# Patient Record
Sex: Male | Born: 1960
Health system: Southern US, Community
[De-identification: ages and names within clinical notes are randomized; demographics above are authoritative.]

## PROBLEM LIST (undated history)

## (undated) DIAGNOSIS — G8929 Other chronic pain: Secondary | ICD-10-CM

## (undated) DIAGNOSIS — I10 Essential (primary) hypertension: Secondary | ICD-10-CM

## (undated) DIAGNOSIS — E079 Disorder of thyroid, unspecified: Secondary | ICD-10-CM

## (undated) DIAGNOSIS — M5416 Radiculopathy, lumbar region: Secondary | ICD-10-CM

## (undated) DIAGNOSIS — E039 Hypothyroidism, unspecified: Secondary | ICD-10-CM

## (undated) DIAGNOSIS — E785 Hyperlipidemia, unspecified: Secondary | ICD-10-CM

## (undated) DIAGNOSIS — M549 Dorsalgia, unspecified: Secondary | ICD-10-CM

## (undated) DIAGNOSIS — M1990 Unspecified osteoarthritis, unspecified site: Secondary | ICD-10-CM

## (undated) HISTORY — PX: OTHER SURGICAL HISTORY: SHX169

## (undated) HISTORY — PX: BACK SURGERY: SHX140

## (undated) HISTORY — PX: THYROIDECTOMY: SHX17

---

## 1999-04-18 ENCOUNTER — Inpatient Hospital Stay (HOSPITAL_COMMUNITY): Admission: RE | Admit: 1999-04-18 | Discharge: 1999-04-19 | Payer: Self-pay | Admitting: Neurosurgery

## 1999-04-18 ENCOUNTER — Encounter: Payer: Self-pay | Admitting: Neurosurgery

## 1999-05-09 ENCOUNTER — Ambulatory Visit (HOSPITAL_COMMUNITY): Admission: RE | Admit: 1999-05-09 | Discharge: 1999-05-09 | Payer: Self-pay | Admitting: Neurosurgery

## 1999-05-09 ENCOUNTER — Encounter: Payer: Self-pay | Admitting: Neurosurgery

## 2003-11-16 ENCOUNTER — Ambulatory Visit (HOSPITAL_COMMUNITY): Admission: RE | Admit: 2003-11-16 | Discharge: 2003-11-16 | Payer: Self-pay | Admitting: Family Medicine

## 2004-04-24 ENCOUNTER — Ambulatory Visit (HOSPITAL_COMMUNITY): Admission: RE | Admit: 2004-04-24 | Discharge: 2004-04-24 | Payer: Self-pay | Admitting: Family Medicine

## 2005-05-17 ENCOUNTER — Emergency Department (HOSPITAL_COMMUNITY): Admission: EM | Admit: 2005-05-17 | Discharge: 2005-05-17 | Payer: Self-pay | Admitting: Emergency Medicine

## 2008-07-03 ENCOUNTER — Ambulatory Visit (HOSPITAL_COMMUNITY): Admission: RE | Admit: 2008-07-03 | Discharge: 2008-07-03 | Payer: Self-pay | Admitting: Family Medicine

## 2008-07-06 ENCOUNTER — Encounter (INDEPENDENT_AMBULATORY_CARE_PROVIDER_SITE_OTHER): Payer: Self-pay | Admitting: Diagnostic Radiology

## 2008-07-06 ENCOUNTER — Ambulatory Visit (HOSPITAL_COMMUNITY): Admission: RE | Admit: 2008-07-06 | Discharge: 2008-07-06 | Payer: Self-pay | Admitting: Family Medicine

## 2008-08-29 ENCOUNTER — Encounter (INDEPENDENT_AMBULATORY_CARE_PROVIDER_SITE_OTHER): Payer: Self-pay | Admitting: Otolaryngology

## 2008-08-29 ENCOUNTER — Ambulatory Visit (HOSPITAL_COMMUNITY): Admission: RE | Admit: 2008-08-29 | Discharge: 2008-08-30 | Payer: Self-pay | Admitting: Otolaryngology

## 2010-02-27 ENCOUNTER — Emergency Department (HOSPITAL_COMMUNITY): Admission: EM | Admit: 2010-02-27 | Discharge: 2010-02-27 | Payer: Self-pay | Admitting: Emergency Medicine

## 2010-08-05 ENCOUNTER — Emergency Department (HOSPITAL_COMMUNITY): Admission: EM | Admit: 2010-08-05 | Discharge: 2010-08-05 | Payer: Self-pay | Admitting: Emergency Medicine

## 2010-12-07 ENCOUNTER — Emergency Department (HOSPITAL_COMMUNITY)
Admission: EM | Admit: 2010-12-07 | Discharge: 2010-12-07 | Disposition: A | Discharge status: Home / Self Care | Payer: BC Managed Care – PPO | Attending: Emergency Medicine | Admitting: Emergency Medicine

## 2010-12-07 ENCOUNTER — Emergency Department (HOSPITAL_COMMUNITY): Payer: BC Managed Care – PPO

## 2010-12-07 DIAGNOSIS — M545 Low back pain, unspecified: Secondary | ICD-10-CM | POA: Insufficient documentation

## 2010-12-07 DIAGNOSIS — M25559 Pain in unspecified hip: Secondary | ICD-10-CM | POA: Insufficient documentation

## 2010-12-08 LAB — URINALYSIS, ROUTINE W REFLEX MICROSCOPIC
Leukocytes, UA: NEGATIVE
pH: 6.5 (ref 5.0–8.0)

## 2010-12-08 LAB — URINE MICROSCOPIC-ADD ON

## 2010-12-08 LAB — BASIC METABOLIC PANEL
BUN: 13 mg/dL (ref 6–23)
CO2: 26 mEq/L (ref 19–32)
Chloride: 106 mEq/L (ref 96–112)
GFR calc Af Amer: >60 mL/min (ref >60)
Potassium: 3.5 mEq/L (ref 3.5–5.1)
Sodium: 138 mEq/L (ref 135–145)

## 2011-02-03 NOTE — Op Note (Signed)
[name], [name]               ACCOUNT NO.:  [id number]   MEDICAL RECORD NO.:  [id number]          PATIENT TYPE:  OIB   LOCATION:  5127                         FACILITY:  [facility]   PHYSICIAN:  Jefry H. Pollyann Kennedy, MD     DATE OF BIRTH:  July 21, 1961   DATE OF PROCEDURE:  08/29/2008  DATE OF DISCHARGE:                               OPERATIVE REPORT   PREOPERATIVE DIAGNOSIS:  Thyroid mass.   POSTOPERATIVE DIAGNOSIS:  Thyroid mass.   PROCEDURE:  Total thyroidectomy.   SURGEON:  Jefry H. Pollyann Kennedy, MD   ANESTHESIA:  General endotracheal anesthesia was used.   COMPLICATIONS:  No complications.   ASSISTANT:  Suzanna Obey, MD   BLOOD LOSS:  Minimal.   FINDINGS:  Large mass involving the central portion of the thyroid,  appeared to initiate in the isthmus but extended over to both lobes.  The remainder of the thyroid gland was somewhat enlarged as well.   REFERRING PHYSICIAN:  Kirk Ruths, M.D.   HISTORY:  This is a 50 year old who had an anterior neck mass for  several months.  Needle aspiration biopsy revealed follicular cells with  microfollicular patterns suspicious for follicular neoplasm.  Risks,  benefits, alternatives, complications of the procedure were explained to  the patient; he seemed to understand and agrees for surgery.   PROCEDURE:  The patient was taken to the operating room and placed on  the operating table in the supine position.  Following induction of  general endotracheal anesthesia, the patient was prepped and draped in  the standard fashion.  A transverse incision was outlined just above the  clavicle and electrocautery was used to incise the skin and subcutaneous  tissue.  A subplatysmal flap was developed superiorly to the thyroid  notch and inferiorly to the clavicle.  Self-retaining thyroid retractor  was used throughout the case.  Midline fascia was divided and the strap  muscles were reflected bilaterally.  The central mass was identified.  The  complete gland was then dissected starting on the right side and  completing on the left side.  The initial dissection was the superior  pole where the vessels were separately dissected, ligated between  clamps, and divided.  The 4-0 silk ties were used throughout the case.  The middle thyroid vein was also dissected and ligated and divided.  After the superior pole was brought down, attention was then paid to the  inferior pole where the vasculature was similarly ligated and divided.  A superior parathyroid was identified on the right, was preserved with  its blood supply, and had good color at the end of the case.  The  recurrent nerve was identified as I traveled up the tracheoesophageal  groove and the entrance into the larynx.  This was preserved.  The  ligament of Allyson Sabal was divided using silk ties as well and cautery  dissection.  The thyroid was brought off the face of the trachea.  The  same procedure was then performed on the left side bringing the left  lobe forward.  The entire lobe was then dissected off the trachea and  the larynx.  It was sent for pathologic evaluation.  The wound was  irrigated with saline.  Hemostasis was completed using bipolar cautery  and additional silk ties as needed.  The wound was closed in layers  using 3-0 chromic on the midline fascia and the platysma layer.  A 7-  [place] round JP drain was left in the wound and exited through the left  end of the incisions, secured in place with a nylon suture.  Dermabond  was used to reappose the skin.  The patient was awakened, extubated, and  transferred to recovery in stable condition.      Jefry H. Pollyann Kennedy, MD  Electronically Signed     JHR/MEDQ  D:  08/29/2008  T:  08/30/2008  Job:  [id number]   cc:   Kirk Ruths, M.D.

## 2011-06-26 LAB — BASIC METABOLIC PANEL
CO2: 27 mEq/L (ref 19–32)
Calcium: 10 mg/dL (ref 8.4–10.5)
GFR calc Af Amer: >60 mL/min (ref >60)
GFR calc non Af Amer: >60 mL/min (ref >60)
Potassium: 4 mEq/L (ref 3.5–5.1)

## 2011-06-26 LAB — CBC
HCT: 48.3 % (ref 39.0–52.0)
Hemoglobin: 16 g/dL (ref 13.0–17.0)
MCV: 94.9 fL (ref 78.0–100.0)
RBC: 5.09 MIL/uL (ref 4.22–5.81)

## 2011-06-26 LAB — CALCIUM: Calcium: 9.2 mg/dL (ref 8.4–10.5)

## 2011-09-23 ENCOUNTER — Telehealth: Payer: Self-pay

## 2011-09-23 NOTE — Telephone Encounter (Signed)
LMOM to call.

## 2011-10-13 NOTE — Telephone Encounter (Signed)
Letter was mailed on 09/30/2011 to call.

## 2011-10-13 NOTE — Telephone Encounter (Signed)
LMOM to call and also mailing a letter to call.

## 2012-01-11 ENCOUNTER — Other Ambulatory Visit: Payer: Self-pay

## 2012-01-11 ENCOUNTER — Emergency Department (HOSPITAL_COMMUNITY): Payer: BC Managed Care – PPO

## 2012-01-11 ENCOUNTER — Encounter (HOSPITAL_COMMUNITY): Payer: Self-pay

## 2012-01-11 ENCOUNTER — Emergency Department (HOSPITAL_COMMUNITY)
Admission: EM | Admit: 2012-01-11 | Discharge: 2012-01-11 | Disposition: A | Discharge status: Home / Self Care | Payer: BC Managed Care – PPO | Attending: Emergency Medicine | Admitting: Emergency Medicine

## 2012-01-11 DIAGNOSIS — I10 Essential (primary) hypertension: Secondary | ICD-10-CM | POA: Diagnosis present

## 2012-01-11 DIAGNOSIS — M5412 Radiculopathy, cervical region: Secondary | ICD-10-CM | POA: Insufficient documentation

## 2012-01-11 DIAGNOSIS — R208 Other disturbances of skin sensation: Secondary | ICD-10-CM

## 2012-01-11 DIAGNOSIS — M542 Cervicalgia: Secondary | ICD-10-CM | POA: Insufficient documentation

## 2012-01-11 DIAGNOSIS — R209 Unspecified disturbances of skin sensation: Secondary | ICD-10-CM | POA: Insufficient documentation

## 2012-01-11 DIAGNOSIS — R51 Headache: Secondary | ICD-10-CM | POA: Insufficient documentation

## 2012-01-11 DIAGNOSIS — M25519 Pain in unspecified shoulder: Secondary | ICD-10-CM | POA: Insufficient documentation

## 2012-01-11 DIAGNOSIS — E785 Hyperlipidemia, unspecified: Secondary | ICD-10-CM | POA: Insufficient documentation

## 2012-01-11 HISTORY — DX: Hyperlipidemia, unspecified: E78.5

## 2012-01-11 HISTORY — DX: Essential (primary) hypertension: I10

## 2012-01-11 LAB — BASIC METABOLIC PANEL
Chloride: 100 mEq/L (ref 96–112)
Glucose, Bld: 91 mg/dL (ref 70–99)

## 2012-01-11 LAB — CBC
HCT: 43.5 % (ref 39.0–52.0)
MCH: 30.7 pg (ref 26.0–34.0)
MCHC: 33.3 g/dL (ref 30.0–36.0)
Platelets: 260 10*3/uL (ref 150–400)
RDW: 13.7 % (ref 11.5–15.5)

## 2012-01-11 LAB — DIFFERENTIAL
Basophils Relative: 0 % (ref 0–1)
Eosinophils Absolute: 0.2 10*3/uL (ref 0.0–0.7)
Lymphocytes Relative: 31 % (ref 12–46)
Monocytes Absolute: 0.4 10*3/uL (ref 0.1–1.0)
Monocytes Relative: 6 % (ref 3–12)
Neutro Abs: 3.4 10*3/uL (ref 1.7–7.7)

## 2012-01-11 LAB — POCT I-STAT TROPONIN I: Troponin i, poc: 0.01 ng/mL (ref 0.00–0.08)

## 2012-01-11 MED ORDER — HYDROCODONE-ACETAMINOPHEN 5-325 MG PO TABS
2.0000 | ORAL_TABLET | Freq: Once | ORAL | Status: AC
  Administered 2012-01-11: 2 via ORAL
  Filled 2012-01-11: qty 2

## 2012-01-11 MED ORDER — ASPIRIN 325 MG PO TABS
325.0000 mg | ORAL_TABLET | Freq: Once | ORAL | Status: AC
  Administered 2012-01-11: 325 mg via ORAL
  Filled 2012-01-11: qty 1

## 2012-01-11 MED ORDER — IBUPROFEN 800 MG PO TABS
800.0000 mg | ORAL_TABLET | Freq: Once | ORAL | Status: AC
  Administered 2012-01-11: 800 mg via ORAL
  Filled 2012-01-11: qty 1

## 2012-01-11 MED ORDER — DIAZEPAM 5 MG PO TABS: 5.0000 mg | ORAL_TABLET | Freq: Four times a day (QID) | ORAL | Status: AC | PRN

## 2012-01-11 MED ORDER — IBUPROFEN 800 MG PO TABS: 800.0000 mg | ORAL_TABLET | Freq: Three times a day (TID) | ORAL | Status: AC | PRN

## 2012-01-11 MED ORDER — HYDROCODONE-ACETAMINOPHEN 5-325 MG PO TABS: 1.0000 | ORAL_TABLET | ORAL | Status: AC | PRN

## 2012-01-11 MED ORDER — DIAZEPAM 5 MG PO TABS
5.0000 mg | ORAL_TABLET | Freq: Once | ORAL | Status: AC
  Administered 2012-01-11: 5 mg via ORAL
  Filled 2012-01-11: qty 1

## 2012-01-11 NOTE — Discharge Instructions (Signed)
Cervical Radiculopathy Cervical radiculopathy happens when a nerve in the neck is pinched or bruised by a slipped (herniated) disk or by arthritic changes in the bones of the cervical spine. This can occur due to an injury or as part of the normal aging process. Pressure on the cervical nerves can cause pain or numbness that runs from your neck all the way down into your arm and fingers. CAUSES  There are many possible causes, including:  Injury.   Muscle tightness in the neck from overuse.   Swollen, painful joints (arthritis).   Breakdown or degeneration in the bones and joints of the spine (spondylosis) due to aging.   Bone spurs that may develop near the cervical nerves.  SYMPTOMS  Symptoms include pain, weakness, or numbness in the affected arm and hand. Pain can be severe or irritating. Symptoms may be worse when extending or turning the neck. DIAGNOSIS  Your caregiver will ask about your symptoms and do a physical exam. He or she may test your strength and reflexes. X-rays, CT scans, and MRI scans may be needed in cases of injury or if the symptoms do not go away after a period of time. Electromyography (EMG) or nerve conduction testing may be done to study how your nerves and muscles are working. TREATMENT  Your caregiver may recommend certain exercises to help relieve your symptoms. Cervical radiculopathy can, and often does, get better with time and treatment. If your problems continue, treatment options may include:  Wearing a soft collar for short periods of time.   Physical therapy to strengthen the neck muscles.   Medicines, such as nonsteroidal anti-inflammatory drugs (NSAIDs), oral corticosteroids, or spinal injections.   Surgery. Different types of surgery may be done depending on the cause of your problems.  HOME CARE INSTRUCTIONS   Put ice on the affected area.   Put ice in a plastic bag.   Place a towel between your skin and the bag.   Leave the ice on for 15  to 20 minutes, 3 to 4 times a day or as directed by your caregiver.   Use a flat pillow when you sleep.   Only take over-the-counter or prescription medicines for pain, discomfort, or fever as directed by your caregiver.   If physical therapy was prescribed, follow your caregiver's directions.   If a soft collar was prescribed, use it as directed.  SEEK IMMEDIATE MEDICAL CARE IF:   Your pain gets much worse and cannot be controlled with medicines.   You have weakness or numbness in your hand, arm, face, or leg.   You have a high fever or a stiff, rigid neck.   You lose bowel or bladder control (incontinence).   You have trouble with walking, balance, or speaking.  MAKE SURE YOU:   Understand these instructions.   Will watch your condition.   Will get help right away if you are not doing well or get worse.  Document Released: 06/02/2001 Document Revised: 08/27/2011 Document Reviewed: 04/21/2011 [facility] Patient Information 2012 [place], [place].Paresthesia Paresthesia is an abnormal burning or prickling sensation. This sensation is generally felt in the hands, arms, legs, or feet. However, it may occur in any part of the body. It is usually not painful. The feeling may be described as:  Tingling or numbness.   "Pins and needles."   Skin crawling.   Buzzing.   Limbs "falling asleep."   Itching.  Most people experience temporary (transient) paresthesia at some time in their lives. CAUSES  Paresthesia may occur when you breathe too quickly (hyperventilation). It can also occur without any apparent cause. Commonly, paresthesia occurs when pressure is placed on a nerve. The feeling quickly goes away once the pressure is removed. For some people, however, paresthesia is a long-lasting (chronic) condition caused by an underlying disorder. The underlying disorder may be:  A traumatic, direct injury to nerves. Examples include a:   Broken (fractured) neck.   Fractured skull.     A disorder affecting the brain and spinal cord (central nervous system). Examples include:   Transverse myelitis.   Encephalitis.   Transient ischemic attack.   Multiple sclerosis.   Stroke.   Tumor or blood vessel problems, such as an arteriovenous malformation pressing against the brain or spinal cord.   A condition that damages the peripheral nerves (peripheral neuropathy). Peripheral nerves are not part of the brain and spinal cord. These conditions include:   Diabetes.   Peripheral vascular disease.   Nerve entrapment syndromes, such as carpal tunnel syndrome.   Shingles.   Hypothyroidism.   Vitamin B12 deficiencies.   Alcoholism.   Heavy metal poisoning (lead, arsenic).   Rheumatoid arthritis.   Systemic lupus erythematosus.  DIAGNOSIS  Your caregiver will attempt to find the underlying cause of your paresthesia. Your caregiver may:  Take your medical history.   Perform a physical exam.   Order various lab tests.   Order imaging tests.  TREATMENT  Treatment for paresthesia depends on the underlying cause. HOME CARE INSTRUCTIONS  Avoid drinking alcohol.   You may consider massage or acupuncture to help relieve your symptoms.   Keep all follow-up appointments as directed by your caregiver.  SEEK IMMEDIATE MEDICAL CARE IF:   You feel weak.   You have trouble walking or moving.   You have problems with speech or vision.   You feel confused.   You cannot control your bladder or bowel movements.   You feel numbness after an injury.   You faint.   Your burning or prickling feeling gets worse when walking.   You have pain, cramps, or dizziness.   You develop a rash.  MAKE SURE YOU:  Understand these instructions.   Will watch your condition.   Will get help right away if you are not doing well or get worse.  Document Released: 08/28/2002 Document Revised: 08/27/2011 Document Reviewed: 05/29/2011 [facility] Patient Information 2012  [place], [place].Peripheral Nerve Problems Peripheral nerve disorders are problems with the nerves in your arms or legs. CAUSES  There are many different causes of these disorders. They include:  Injury.   Diabetes.   Chronic alcoholism.   Toxic chemicals and drugs.   Vitamin deficiencies.   Tumors.   Liver or kidney diseases.  SYMPTOMS  Some of the problems caused are:  Tingling, burning, pain, and numbness in the extremities and feet.   Weakness, loss of muscle tone, and size.  DIAGNOSIS  Sometimes blood tests and studies to examine nerve function are needed to make the diagnosis.  TREATMENT  Sometimes peripheral nerve problems can be treated with vitamins, medication, and avoiding known toxins such as alcohol. Please make a follow-up appointment to be sure you are getting better with treatment.  SEEK MEDICAL CARE IF:   You are not better after one week of treatment.   You have worsening of problems or have breathing trouble.  Document Released: 10/15/2004 Document Revised: 08/27/2011 Document Reviewed: 09/07/2005 [facility] Patient Information 2012 [place], [place].Neuropathic Pain We often think that pain has a  physical cause. If we get rid of the cause, the pain should go away. Nerves themselves can also cause pain. It is called neuropathic pain, which means nerve abnormality. It may be difficult for the patients who have it and for the treating caregivers. Pain is usually described as acute (short-lived) or chronic (long-lasting). Acute pain is related to the physical sensations caused by an injury. It can last from a few seconds to many weeks, but it usually goes away when normal healing occurs. Chronic pain lasts beyond the typical healing time. With neuropathic pain, the nerve fibers themselves may be damaged or injured. They then send incorrect signals to other pain centers. The pain you feel is real, but the cause is not easy to find.  CAUSES  Chronic pain can result from  diseases, such as diabetes and shingles (an infection related to chickenpox), or from trauma, surgery, or amputation. It can also happen without any known injury or disease. The nerves are sending pain messages, even though there is no identifiable cause for such messages.   Other common causes of neuropathy include diabetes, phantom limb pain, or Regional Pain Syndrome (RPS).   As with all forms of chronic back pain, if neuropathy is not correctly treated, there can be a number of associated problems that lead to a downward cycle for the patient. These include depression, sleeplessness, feelings of fear and anxiety, limited social interaction and inability to do normal daily activities or work.   The most dramatic and mysterious example of neuropathic pain is called "phantom limb syndrome." This occurs when an arm or a leg has been removed because of illness or injury. The brain still gets pain messages from the nerves that originally carried impulses from the missing limb. These nerves now seem to misfire and cause troubling pain.   Neuropathic pain often seems to have no cause. It responds poorly to standard pain treatment.  Neuropathic pain can occur after:  Shingles (herpes zoster virus infection).   A lasting burning sensation of the skin, caused usually by injury to a peripheral nerve.   Peripheral neuropathy which is widespread nerve damage, often caused by diabetes or alcoholism.   Phantom limb pain following an amputation.   Facial nerve problems (trigeminal neuralgia).   Multiple sclerosis.   Reflex sympathetic dystrophy.   Pain which comes with cancer and cancer chemotherapy.   Entrapment neuropathy such as when pressure is put on a nerve such as in carpal tunnel syndrome.   Back, leg, and hip problems (sciatica).   Spine or back surgery.   HIV Infection or AIDS where nerves are infected by viruses.  Your caregiver can explain items in the above list which may apply to  you. SYMPTOMS  Characteristics of neuropathic pain are:  Severe, sharp, electric shock-like, shooting, lightening-like, knife-like.   Pins and needles sensation.   Deep burning, deep cold, or deep ache.   Persistent numbness, tingling, or weakness.   Pain resulting from light touch or other stimulus that would not usually cause pain.   Increased sensitivity to something that would normally cause pain, such as a pinprick.  Pain may persist for months or years following the healing of damaged tissues. When this happens, pain signals no longer sound an alarm about current injuries or injuries about to happen. Instead, the alarm system itself is not working correctly.  Neuropathic pain may get worse instead of better over time. For some people, it can lead to serious disability. It is important to be  aware that severe injury in a limb can occur without a proper, protective pain response.Burns, cuts, and other injuries may go unnoticed. Without proper treatment, these injuries can become infected or lead to further disability. Take any injury seriously, and consult your caregiver for treatment. DIAGNOSIS  When you have a pain with no known cause, your caregiver will probably ask some specific questions:   Do you have any other conditions, such as diabetes, shingles, multiple sclerosis, or HIV infection?   How would you describe your pain? (Neuropathic pain is often described as shooting, stabbing, burning, or searing.)   Is your pain worse at any time of the day? (Neuropathic pain is usually worse at night.)   Does the pain seem to follow a certain physical pathway?   Does the pain come from an area that has missing or injured nerves? (An example would be phantom limb pain.)   Is the pain triggered by minor things such as rubbing against the sheets at night?  These questions often help define the type of pain involved. Once your caregiver knows what is happening, treatment can begin.  Anticonvulsant, antidepressant drugs, and various pain relievers seem to work in some cases. If another condition, such as diabetes is involved, better management of that disorder may relieve the neuropathic pain.  TREATMENT  Neuropathic pain is frequently long-lasting and tends not to respond to treatment with narcotic type pain medication. It may respond well to other drugs such as antiseizure and antidepressant medications. Usually, neuropathic problems do not completely go away, but partial improvement is often possible with proper treatment. Your caregivers have large numbers of medications available to treat you. Do not be discouraged if you do not get immediate relief. Sometimes different medications or a combination of medications will be tried before you receive the results you are hoping for. See your caregiver if you have pain that seems to be coming from nowhere and does not go away. Help is available.  SEEK IMMEDIATE MEDICAL CARE IF:   There is a sudden change in the quality of your pain, especially if the change is on only one side of the body.   You notice changes of the skin, such as redness, black or purple discoloration, swelling, or an ulcer.   You cannot move the affected limbs.  Document Released: 06/04/2004 Document Revised: 08/27/2011 Document Reviewed: 06/04/2004 [facility] Patient Information 2012 [place], [place].Radicular Pain Radicular pain in either the arm or leg is usually from a bulging or herniated disk in the spine. A piece of the herniated disk may press against the nerves as the nerves exit the spine. This causes pain which is felt at the tips of the nerves down the arm or leg. Other causes of radicular pain may include:  Fractures.   Heart disease.   Cancer.   An abnormal and usually degenerative state of the nervous system or nerves (neuropathy).  Diagnosis may require CT or MRI scanning to determine the primary cause.  Nerves that start at the neck (nerve  roots) may cause radicular pain in the outer shoulder and arm. It can spread down to the thumb and fingers. The symptoms vary depending on which nerve root has been affected. In most cases radicular pain improves with conservative treatment. Neck problems may require physical therapy, a neck collar, or cervical traction. Treatment may take many weeks, and surgery may be considered if the symptoms do not improve.  Conservative treatment is also recommended for sciatica. Sciatica causes pain to radiate from  the lower back or buttock area down the leg into the foot. Often there is a history of back problems. Most patients with sciatica are better after 2 to 4 weeks of rest and other supportive care. Short term bed rest can reduce the disk pressure considerably. Sitting, however, is not a good position since this increases the pressure on the disk. You should avoid bending, lifting, and all other activities which make the problem worse. Traction can be used in severe cases. Surgery is usually reserved for patients who do not improve within the first months of treatment. Only take over-the-counter or prescription medicines for pain, discomfort, or fever as directed by your caregiver. Narcotics and muscle relaxants may help by relieving more severe pain and spasm and by providing mild sedation. Cold or massage can give significant relief. Spinal manipulation is not recommended. It can increase the degree of disc protrusion. Epidural steroid injections are often effective treatment for radicular pain. These injections deliver medicine to the spinal nerve in the space between the protective covering of the spinal cord and back bones (vertebrae). Your caregiver can give you more information about steroid injections. These injections are most effective when given within two weeks of the onset of pain.  You should see your caregiver for follow up care as recommended. A program for neck and back injury rehabilitation with  stretching and strengthening exercises is an important part of management.  SEEK IMMEDIATE MEDICAL CARE IF:  You develop increased pain, weakness, or numbness in your arm or leg.   You develop difficulty with bladder or bowel control.   You develop abdominal pain.  Document Released: 10/15/2004 Document Revised: 08/27/2011 Document Reviewed: 12/31/2008 [facility] Patient Information 2012 [place], [place].

## 2012-01-11 NOTE — ED Notes (Signed)
Patient lying on stretcher; states pain has eased some.  Now rates as 3-4/10.

## 2012-01-11 NOTE — ED Provider Notes (Addendum)
History     CSN: [id number]  Arrival date & time 01/11/12  0154   First MD Initiated Contact with Patient 01/11/12 0203      Chief Complaint  Patient presents with  . Numbness    (Consider location/radiation/quality/duration/timing/severity/associated sxs/prior treatment) Patient is a 51 y.o. male presenting with neurologic complaint. The history is provided by the patient and the spouse.  Neurologic Problem The primary symptoms include paresthesias and focal weakness. Primary symptoms do not include headaches, syncope, loss of consciousness, altered mental status, seizures, dizziness, visual change, loss of sensation, speech change, memory loss, fever, nausea or vomiting. The symptoms began 6 to 12 hours ago (11 to 12 hours ago, yesterday afternoon, patient is not sure of the exact time of onset as symptoms were insidious in onset.). The episode lasted 12 hours (estimated 11 or 12 hours of dysesthesia/pain down his lateral left upper extremity from deltoid to hand in c5-c7 distribution.  Occasional intermittent paresthesias at fingertips of left digits 2&3 x many weeks.). The symptoms are improving. The neurological symptoms are focal (left upper extremity.  No neck pain or stiffness.  Remote history of ruptured cervical disc with subsequent cervical fusion (plate, anterior) ). Context: no contextual factors, exacerbating/alleviating factors, or other associated symptoms.  No recent trauma, no neck pain.  No other symptoms. Subjective left grip strength weakness per patient without objective defecit on exam.   The paresthesias are improving. The paresthesias are described as burning and pricking.  Additional symptoms include weakness (left grip strength of hand, subjective only, no objective defecit) and pain. Additional symptoms do not include neck stiffness, lower back pain, leg pain, loss of balance, photophobia, nystagmus, hyperacusis, hearing loss, tinnitus, vertigo, anxiety, irritability or  dysphoric mood. Medical issues also include hypertension. Medical issues do not include seizures, cerebral vascular accident, diabetes or recent surgery. Procedure history comments: none.    Past Medical History  Diagnosis Date  . Hypertension 01/11/2012    Elevated BP on ED presentation for what is likely unrelated symptoms, patient reports a history of hypertension that he is not currently taking medications for, and ECG today and prior suggest left ventricular hypertrophy, a likely sequelae of chronic untreated hypertension if indeed it is present.  Strongly encouraged pt. To contact PCP this week (01/11/12) for re-eval and to initiate antihyperte  . Hyperlipidemia 01/11/2012    Pt. Reported history of hyperlipidemia, not currently taking medications for this condition    Past Surgical History  Procedure Date  . Thyroidectomy     No family history on file.  History  Substance Use Topics  . Smoking status: Current Everyday Smoker  . Smokeless tobacco: Not on file  . Alcohol Use: Yes      Review of Systems  Constitutional: Negative for fever, chills, diaphoresis, activity change, appetite change, irritability, fatigue and unexpected weight change.  HENT: Negative for hearing loss, ear pain, nosebleeds, congestion, sore throat, facial swelling, rhinorrhea, sneezing, mouth sores, trouble swallowing, neck pain, neck stiffness, dental problem, postnasal drip, sinus pressure, tinnitus and ear discharge.   Eyes: Negative for photophobia, pain, discharge, redness, itching and visual disturbance.  Respiratory: Negative for cough, chest tightness and shortness of breath.   Cardiovascular: Negative for chest pain, palpitations and syncope.  Gastrointestinal: Negative for nausea, vomiting, abdominal pain and diarrhea.  Genitourinary: Negative.   Musculoskeletal: Negative for myalgias and back pain.  Skin: Negative for color change, pallor, rash and wound.  Neurological: Positive for focal  weakness, weakness (left grip strength of  hand, subjective only, no objective defecit) and paresthesias. Negative for dizziness, vertigo, tremors, speech change, seizures, loss of consciousness, syncope, facial asymmetry, speech difficulty, light-headedness, numbness, headaches and loss of balance.  Hematological: Negative for adenopathy. Does not bruise/bleed easily.  Psychiatric/Behavioral: Negative.  Negative for memory loss, dysphoric mood and altered mental status.  All other systems reviewed and are negative.    Allergies  Review of patient's allergies indicates no known allergies.  Home Medications   Current Outpatient Rx  Name Route Sig Dispense Refill  . LEVOTHYROXINE SODIUM 100 MCG PO TABS Oral Take 100 mcg by mouth daily.      BP 169/115  Pulse 86  Temp(Src) 98 F (36.7 C) (Oral)  Resp 18  Ht 5\' 9"  (1.753 m)  Wt 175 lb (79.379 kg)  BMI 25.84 kg/m2  SpO2 98%  Physical Exam  Nursing note and vitals reviewed. Constitutional: He is oriented to person, place, and time. He appears well-nourished. He is active and cooperative.  Non-toxic appearance. He does not have a sickly appearance. He does not appear ill. No distress.  HENT:  Head: Normocephalic and atraumatic. Head is without raccoon's eyes, without Battle's sign and without contusion. No trismus in the jaw.  Right Ear: Hearing, tympanic membrane, external ear and ear canal normal.  Left Ear: Hearing, tympanic membrane, external ear and ear canal normal.  Nose: Nose normal.  Mouth/Throat: Uvula is midline, oropharynx is clear and moist and mucous membranes are normal. No uvula swelling. No oropharyngeal exudate.  Eyes: Conjunctivae, EOM and lids are normal. Pupils are equal, round, and reactive to light. Right eye exhibits no chemosis, no discharge and no exudate. Left eye exhibits no chemosis, no discharge and no exudate. Right conjunctiva is not injected. Left conjunctiva is not injected. Right eye exhibits normal  extraocular motion and no nystagmus. Left eye exhibits normal extraocular motion and no nystagmus.  Neck: Trachea normal, normal range of motion and phonation normal. Neck supple. No JVD present. Carotid bruit is not present. No rigidity. Normal range of motion present. No Brudzinski's sign noted.  Cardiovascular: Normal rate, regular rhythm, S1 normal, S2 normal, intact distal pulses and normal pulses.  Exam reveals no gallop and no friction rub.   Pulmonary/Chest: Effort normal and breath sounds normal. No accessory muscle usage. Not tachypneic. No respiratory distress. He has no decreased breath sounds. He has no wheezes. He has no rhonchi. He has no rales.  Abdominal: Soft. Normal appearance, normal aorta and bowel sounds are normal. He exhibits no pulsatile midline mass. There is no tenderness. There is no rigidity, no rebound and no guarding.  Musculoskeletal:       Cervical back: Normal. He exhibits normal range of motion, no tenderness, no bony tenderness, no deformity, no pain and no spasm.       Back:  Neurological: He is alert and oriented to person, place, and time. He has normal strength and normal reflexes. He is not disoriented. He displays no tremor and normal reflexes. No cranial nerve deficit or sensory deficit. He exhibits normal muscle tone. He displays no seizure activity. Coordination and gait normal. GCS eye subscore is 4. GCS verbal subscore is 5. GCS motor subscore is 6.  Reflex Scores:      Bicep reflexes are 2+ on the right side and 2+ on the left side.      Brachioradialis reflexes are 2+ on the right side and 2+ on the left side.      Patellar reflexes are 2+ on  the right side and 2+ on the left side.      NIHSS completed at initial evaluation with a score of 0.  No objective neurologic deficits, including intact sensation bilaterally.  The distribution of reported dysesthesia seems to correlate within distribution of  dermatomes C5-C7 suggesting radiculopathy, either at  the cervical foramen or peripherally as nerves course through muscle.  No axillary pain or mass to suggest brachial plexus lesion.  Abduction, adduction, flexion, and extension at the shoulders bilaterally with preserved range of motion and 5 out of 5 symmetrical strength. Flexion, extension, supination, pronation of the elbows and wrists as well as abduction and at the junction of the wrists and fingers are all preserved and symmetrical with 5 of 5 strength bilaterally. Intrinsic functions of both hands and grip strength is 5 out of 5 bilaterally and symmetric. Lower extremity strength is 5 out of 5, appropriate and symmetric bilaterally.  Skin: Skin is warm, dry and intact. He is not diaphoretic. No pallor.  Psychiatric: He has a normal mood and affect. His speech is normal and behavior is normal. Thought content normal. Cognition and memory are normal.    ED Course  Procedures (including critical care time)   Date: 01/11/2012  Rate: 74  Rhythm: normal sinus rhythm  QRS Axis: normal  Intervals: normal  ST/T Wave abnormalities: ST elevations anteriorly, early repolarization and Concave ST segments at areas of elevation with findings suggestive of left ventricular hypertrophy as the likely cause of early repolarization changes which are unchanged from previous EKG and not suspected to represent STEMI  Conduction Disutrbances:none  Narrative Interpretation: Findings are concerning for left ventricular hypertrophy, and if present is likely due to untreated hypertension.  Old EKG Reviewed: unchanged    Labs Reviewed  CBC  DIFFERENTIAL  BASIC METABOLIC PANEL  POCT I-STAT TROPONIN I   Ct Head Wo Contrast  01/11/2012  *RADIOLOGY REPORT*  Clinical Data:  Headache.  Left sided neck and shoulder pain for 2 days.  Numbness in the left arm.  No injury.  CT HEAD WITHOUT CONTRAST CT CERVICAL SPINE WITHOUT CONTRAST  Technique:  Multidetector CT imaging of the head and cervical spine was performed  following the standard protocol without intravenous contrast.  Multiplanar CT image reconstructions of the cervical spine were also generated.  Comparison:  None  CT HEAD  Findings: Ventricles and sulci appear symmetrical.  No mass effect or midline shift.  No abnormal extra-axial fluid collections.  Wallace Cullens- white matter junctions are distinct.  Basal cisterns are not effaced.  No ventricular dilatation.  Cavum septum pellucidum.  No evidence of acute intracranial hemorrhage.  Visualized paranasal sinuses and mastoid air cells are not opacified.  No depressed skull fractures.  IMPRESSION: No acute intracranial abnormalities.  CT CERVICAL SPINE  Findings: Reversal of the usual cervical lordosis is likely due to degenerative change and postoperative change.  Ligamentous injury is not excluded.  There are postoperative changes with fusion of C4- C5 and anterior plate and screw fixation and intervertebral fusion of C5-C6.  Degenerative changes at C3-4.  No vertebral compression deformities.  No prevertebral soft tissue swelling.  Lateral masses of C1 appear symmetrical.  The odontoid process appears intact.  No paraspinal soft tissue infiltration.  No focal bone lesion or bone destruction.  IMPRESSION: Postoperative changes from C4-C6.  Degenerative changes.  Reversal of the usual cervical lordosis is likely due to degenerative change and postoperative change.  No displaced fractures identified.  Original Report Authenticated By: Marlon Pel,  M.D.   Ct Cervical Spine Wo Contrast  01/11/2012  *RADIOLOGY REPORT*  Clinical Data:  Headache.  Left sided neck and shoulder pain for 2 days.  Numbness in the left arm.  No injury.  CT HEAD WITHOUT CONTRAST CT CERVICAL SPINE WITHOUT CONTRAST  Technique:  Multidetector CT imaging of the head and cervical spine was performed following the standard protocol without intravenous contrast.  Multiplanar CT image reconstructions of the cervical spine were also generated.   Comparison:  None  CT HEAD  Findings: Ventricles and sulci appear symmetrical.  No mass effect or midline shift.  No abnormal extra-axial fluid collections.  Wallace Cullens- white matter junctions are distinct.  Basal cisterns are not effaced.  No ventricular dilatation.  Cavum septum pellucidum.  No evidence of acute intracranial hemorrhage.  Visualized paranasal sinuses and mastoid air cells are not opacified.  No depressed skull fractures.  IMPRESSION: No acute intracranial abnormalities.  CT CERVICAL SPINE  Findings: Reversal of the usual cervical lordosis is likely due to degenerative change and postoperative change.  Ligamentous injury is not excluded.  There are postoperative changes with fusion of C4- C5 and anterior plate and screw fixation and intervertebral fusion of C5-C6.  Degenerative changes at C3-4.  No vertebral compression deformities.  No prevertebral soft tissue swelling.  Lateral masses of C1 appear symmetrical.  The odontoid process appears intact.  No paraspinal soft tissue infiltration.  No focal bone lesion or bone destruction.  IMPRESSION: Postoperative changes from C4-C6.  Degenerative changes.  Reversal of the usual cervical lordosis is likely due to degenerative change and postoperative change.  No displaced fractures identified.  Original Report Authenticated By: Marlon Pel, M.D.     No diagnosis found.    MDM  The patient does not meet criteria for a code stroke or for thrombolytic treatment of acute stroke do to an NIHSS score of 0 with no objective neurologic deficits. I do not perceive the patient's symptoms to be due to an acute stroke, however I will obtain a CT scan of the patient's brain to further exclude this possibility, and noting that an MRI would be needed for definitive exclusion of stroke, but my suspicion of stroke is not high enough to merit obtaining an MRI of the brain, emergent or otherwise at this time. I do have suspicion about the possibility of his  symptoms being do to cervical radiculopathy or peripheral radiculopathy of the spinal nerves exiting at the cervical level, whether do to spondylosis and neural foraminal stenosis, or peripheral muscle impingement. I have included a CT scan of the patient's cervical spine to further evaluate this as well. If no explanation is offered by imaging and laboratory studies on his workup today, the patient will be referred to outpatient neurology consultation in office for further evaluation of these symptoms.   Regarding the patient's self-reported history of hypertension and hyperlipidemia that he is not taking medications for, I reviewed documented encounters with his primary care clinic, and they are sparse do to apparent difficulty reaching the patient and getting him into the clinic for evaluation and treatment despite documented efforts on behalf of his providers. It appears that his nontreatment for these reported chronic conditions is due to his noncompliance with primary care followup. I discussed his EKG findings worrisome for left ventricular hypertrophy which if that is present would be due to untreated hypertension most likely, and discussed with him the importance of following up with his primary care clinic within this week for reevaluation  of blood pressure and initiation of antihypertensive management as seemed appropriate. The patient states his understanding of and agreement with the plan of care.    Felisa Bonier, MD 01/11/12 0440  4:53 AM No obvious pathology to explain the patient's symptoms is found on CT brain or cervical spine. At this time, my impression is that the most likely cause of the patient's symptoms are peripheral radiculopathy likely due to muscle spasm and I will try treatment of his symptoms and at that cause pending followup with his primary care physician and neurologist as an outpatient. The patient states his understanding of and agreement with the plan of  care.  Felisa Bonier, MD 01/11/12 [phone]

## 2012-01-11 NOTE — ED Notes (Signed)
Intermittent numbness to left arm, mostly to upper arm, worse at night, denies injury or trauma

## 2012-08-23 ENCOUNTER — Encounter (HOSPITAL_COMMUNITY): Payer: Self-pay | Admitting: Emergency Medicine

## 2012-08-23 ENCOUNTER — Emergency Department (HOSPITAL_COMMUNITY)
Admission: EM | Admit: 2012-08-23 | Discharge: 2012-08-23 | Disposition: A | Discharge status: Home / Self Care | Payer: BC Managed Care – PPO | Attending: Emergency Medicine | Admitting: Emergency Medicine

## 2012-08-23 DIAGNOSIS — Z8639 Personal history of other endocrine, nutritional and metabolic disease: Secondary | ICD-10-CM | POA: Insufficient documentation

## 2012-08-23 DIAGNOSIS — Z79899 Other long term (current) drug therapy: Secondary | ICD-10-CM | POA: Insufficient documentation

## 2012-08-23 DIAGNOSIS — Z862 Personal history of diseases of the blood and blood-forming organs and certain disorders involving the immune mechanism: Secondary | ICD-10-CM | POA: Insufficient documentation

## 2012-08-23 DIAGNOSIS — E785 Hyperlipidemia, unspecified: Secondary | ICD-10-CM | POA: Insufficient documentation

## 2012-08-23 DIAGNOSIS — M436 Torticollis: Secondary | ICD-10-CM | POA: Insufficient documentation

## 2012-08-23 DIAGNOSIS — F17200 Nicotine dependence, unspecified, uncomplicated: Secondary | ICD-10-CM | POA: Insufficient documentation

## 2012-08-23 DIAGNOSIS — I10 Essential (primary) hypertension: Secondary | ICD-10-CM | POA: Insufficient documentation

## 2012-08-23 MED ORDER — KETOROLAC TROMETHAMINE 60 MG/2ML IM SOLN
60.0000 mg | Freq: Once | INTRAMUSCULAR | Status: AC
  Administered 2012-08-23: 60 mg via INTRAMUSCULAR
  Filled 2012-08-23: qty 2

## 2012-08-23 MED ORDER — NAPROXEN 500 MG PO TABS: 500.0000 mg | ORAL_TABLET | Freq: Two times a day (BID) | ORAL | Status: DC

## 2012-08-23 MED ORDER — DIAZEPAM 5 MG PO TABS: 5.0000 mg | ORAL_TABLET | Freq: Two times a day (BID) | ORAL | Status: DC

## 2012-08-23 MED ORDER — OXYCODONE-ACETAMINOPHEN 5-325 MG PO TABS: 1.0000 | ORAL_TABLET | ORAL | Status: DC | PRN

## 2012-08-23 NOTE — ED Provider Notes (Signed)
History     CSN: [id number]  Arrival date & time 08/23/12  0621   First MD Initiated Contact with Patient 08/23/12 [phone]      Chief Complaint  Patient presents with  . Torticollis    (Consider location/radiation/quality/duration/timing/severity/associated sxs/prior treatment) HPI Comments: 51 year old male with a history of hypertension and hyperlipidemia who presents with a complaint of pain on the left side of his neck and going up the back side of his neck to his head. He awoke with stiffness in his neck, it has been persistent, nothing makes it better, worse with range of motion of the neck. It is not associated with fevers, chills, nausea, vomiting, weakness, numbness or difficulty using his arms or legs.  The history is provided by the patient.    Past Medical History  Diagnosis Date  . Hypertension 01/11/2012    Elevated BP on ED presentation for what is likely unrelated symptoms, patient reports a history of hypertension that he is not currently taking medications for, and ECG today and prior suggest left ventricular hypertrophy, a likely sequelae of chronic untreated hypertension if indeed it is present.  Strongly encouraged pt. To contact PCP this week (01/11/12) for re-eval and to initiate antihyperte  . Hyperlipidemia 01/11/2012    Pt. Reported history of hyperlipidemia, not currently taking medications for this condition    Past Surgical History  Procedure Date  . Thyroidectomy     No family history on file.  History  Substance Use Topics  . Smoking status: Current Every Day Smoker  . Smokeless tobacco: Not on file  . Alcohol Use: Yes      Review of Systems  Constitutional: Negative for fever.  HENT: Positive for neck pain and neck stiffness. Negative for sore throat.   Respiratory: Negative for cough and shortness of breath.   Cardiovascular: Negative for chest pain.  Gastrointestinal: Negative for nausea and vomiting.    Allergies  Review of patient's  allergies indicates no known allergies.  Home Medications   Current Outpatient Rx  Name  Route  Sig  Dispense  Refill  . LEVOTHYROXINE SODIUM 100 MCG PO TABS   Oral   Take 100 mcg by mouth daily.         Marland Kitchen DIAZEPAM 5 MG PO TABS   Oral   Take 1 tablet (5 mg total) by mouth 2 (two) times daily.   10 tablet   0   . NAPROXEN 500 MG PO TABS   Oral   Take 1 tablet (500 mg total) by mouth 2 (two) times daily with a meal.   30 tablet   0   . OXYCODONE-ACETAMINOPHEN 5-325 MG PO TABS   Oral   Take 1 tablet by mouth every 4 (four) hours as needed for pain.   20 tablet   0     BP 168/106  Pulse 68  Temp 97.6 F (36.4 C) (Oral)  Resp 18  Ht 5\' 11"  (1.803 m)  Wt 165 lb (74.844 kg)  BMI 23.01 kg/m2  SpO2 99%  Physical Exam  Nursing note and vitals reviewed. Constitutional: He appears well-developed and well-nourished. No distress.  HENT:  Head: Normocephalic and atraumatic.  Mouth/Throat: Oropharynx is clear and moist. No oropharyngeal exudate.  Eyes: Conjunctivae normal and EOM are normal. Pupils are equal, round, and reactive to light. Right eye exhibits no discharge. Left eye exhibits no discharge. No scleral icterus.  Neck: No JVD present. No thyromegaly present.       Decreased range  of motion secondary to left neck stiffness, the head is held angled to the left  Cardiovascular: Normal rate, regular rhythm, normal heart sounds and intact distal pulses.  Exam reveals no gallop and no friction rub.   No murmur heard. Pulmonary/Chest: Effort normal and breath sounds normal. No respiratory distress. He has no wheezes. He has no rales.  Musculoskeletal: Normal range of motion. He exhibits tenderness ( Local tenderness to palpation over the musculature of the left neck in the posterior neck.). He exhibits no edema.  Lymphadenopathy:    He has no cervical adenopathy.  Neurological: He is alert. Coordination normal.  Skin: Skin is warm and dry. No rash noted. No erythema.   Psychiatric: He has a normal mood and affect. His behavior is normal.    ED Course  Procedures (including critical care time)  Labs Reviewed - No data to display No results found.   1. Torticollis, acute   2. Hypertension       MDM  The oropharynx is clear, there is no trismus, he has torticollis tenderness left-sided neck without any lymphadenopathy.  He has no pulmonary symptoms and vital signs reflect only mild hypertension. I've encouraged him to followup with his family Dr. for a recheck of his blood pressure, he will be given intramuscular Toradol and sent home with an anti-inflammatory, Valium and a short course of Percocet. The patient appears stable for discharge   Discharge Prescriptions include:    Valium  Naprosyn  Percocet  Vida Roller, MD 08/23/12 [phone]

## 2012-08-23 NOTE — ED Notes (Signed)
Patient woke up this morning with a "crick" in his neck; states went to work and pain began going up the back of his head.

## 2012-08-23 NOTE — ED Notes (Signed)
Instructions, prescriptions and f/u information provided/reviewed - verbalizes understanding.

## 2012-08-28 ENCOUNTER — Encounter (HOSPITAL_COMMUNITY): Payer: Self-pay | Admitting: *Deleted

## 2012-08-28 ENCOUNTER — Emergency Department (HOSPITAL_COMMUNITY)
Admission: EM | Admit: 2012-08-28 | Discharge: 2012-08-28 | Disposition: A | Discharge status: Home / Self Care | Payer: BC Managed Care – PPO | Attending: Emergency Medicine | Admitting: Emergency Medicine

## 2012-08-28 DIAGNOSIS — Z8639 Personal history of other endocrine, nutritional and metabolic disease: Secondary | ICD-10-CM | POA: Insufficient documentation

## 2012-08-28 DIAGNOSIS — M79609 Pain in unspecified limb: Secondary | ICD-10-CM | POA: Insufficient documentation

## 2012-08-28 DIAGNOSIS — M5416 Radiculopathy, lumbar region: Secondary | ICD-10-CM

## 2012-08-28 DIAGNOSIS — IMO0002 Reserved for concepts with insufficient information to code with codable children: Secondary | ICD-10-CM | POA: Insufficient documentation

## 2012-08-28 DIAGNOSIS — E079 Disorder of thyroid, unspecified: Secondary | ICD-10-CM | POA: Insufficient documentation

## 2012-08-28 DIAGNOSIS — Z79899 Other long term (current) drug therapy: Secondary | ICD-10-CM | POA: Insufficient documentation

## 2012-08-28 DIAGNOSIS — M5432 Sciatica, left side: Secondary | ICD-10-CM

## 2012-08-28 DIAGNOSIS — Z791 Long term (current) use of non-steroidal anti-inflammatories (NSAID): Secondary | ICD-10-CM | POA: Insufficient documentation

## 2012-08-28 DIAGNOSIS — M5430 Sciatica, unspecified side: Secondary | ICD-10-CM | POA: Insufficient documentation

## 2012-08-28 DIAGNOSIS — I10 Essential (primary) hypertension: Secondary | ICD-10-CM | POA: Insufficient documentation

## 2012-08-28 DIAGNOSIS — M545 Low back pain: Secondary | ICD-10-CM

## 2012-08-28 DIAGNOSIS — Z862 Personal history of diseases of the blood and blood-forming organs and certain disorders involving the immune mechanism: Secondary | ICD-10-CM | POA: Insufficient documentation

## 2012-08-28 DIAGNOSIS — Z9889 Other specified postprocedural states: Secondary | ICD-10-CM | POA: Insufficient documentation

## 2012-08-28 DIAGNOSIS — F17200 Nicotine dependence, unspecified, uncomplicated: Secondary | ICD-10-CM | POA: Insufficient documentation

## 2012-08-28 HISTORY — DX: Disorder of thyroid, unspecified: E07.9

## 2012-08-28 LAB — POCT I-STAT, CHEM 8
Glucose, Bld: 107 mg/dL — ABNORMAL HIGH (ref 70–99)
HCT: 45 % (ref 39.0–52.0)
Hemoglobin: 15.3 g/dL (ref 13.0–17.0)
Potassium: 4.2 mEq/L (ref 3.5–5.1)
Sodium: 140 mEq/L (ref 135–145)
TCO2: 25 mmol/L (ref 0–100)

## 2012-08-28 MED ORDER — HYDROMORPHONE HCL PF 2 MG/ML IJ SOLN
2.0000 mg | Freq: Once | INTRAMUSCULAR | Status: AC
  Administered 2012-08-28: 2 mg via INTRAMUSCULAR
  Filled 2012-08-28: qty 1

## 2012-08-28 MED ORDER — DEXAMETHASONE SODIUM PHOSPHATE 4 MG/ML IJ SOLN
10.0000 mg | Freq: Once | INTRAMUSCULAR | Status: AC
  Administered 2012-08-28: 10 mg via INTRAMUSCULAR
  Filled 2012-08-28: qty 3

## 2012-08-28 NOTE — ED Provider Notes (Addendum)
History     CSN: [id number]  Arrival date & time 08/28/12  4098   First MD Initiated Contact with Patient 08/28/12 2006      Chief Complaint  Patient presents with  . Hip Pain  . Leg Pain    (Consider location/radiation/quality/duration/timing/severity/associated sxs/prior treatment) HPI  Patient reports he was seen in the ED on December 3 for pain in his neck. He reports that Tuesday, 5 days ago he starting having pain in his left buttock area that radiates down his thigh into his left foot. He states the pain is sharp and shooting. He states sometimes depending on how he sitting he gets numbness in his leg. He states he had something similar about a year ago and got injection in his doctor's office and it went away. He denies any known injury with this episode. He states he woke up with the pain in the morning. He states he has been seen at his doctor's office today it started and also 3 days ago and he has gotten injections both times which are probably steroid injections. He has a couple prescriptions for prednisone and he states he's taking the 10 mg tablets on taper. He also has prescriptions for oxycodone which he was told to stop and he was placed on hydrocodone 7.5 mg. He also had been placed on Valium for his neck pain and he was told to stop the Valium and the Naprosyn. He reports his pain is not improving. He denies any urinary difficulty or rectal incontinence.  PCP Dr. Regino Schultze  Past Medical History  Diagnosis Date  . Hypertension 01/11/2012    Elevated BP on ED presentation for what is likely unrelated symptoms, patient reports a history of hypertension that he is not currently taking medications for, and ECG today and prior suggest left ventricular hypertrophy, a likely sequelae of chronic untreated hypertension if indeed it is present.  Strongly encouraged pt. To contact PCP this week (01/11/12) for re-eval and to initiate antihyperte  . Hyperlipidemia 01/11/2012    Pt.  Reported history of hyperlipidemia, not currently taking medications for this condition  . Thyroid disease     Past Surgical History  Procedure Date  . Thyroidectomy     History reviewed. No pertinent family history.  History  Substance Use Topics  . Smoking status: Current Every Day Smoker -- 0.5 packs/day    Types: Cigarettes  . Smokeless tobacco: Not on file  . Alcohol Use: Yes     Comment: occasional use    employed   Review of Systems  All other systems reviewed and are negative.    Allergies  Review of patient's allergies indicates no known allergies.  Home Medications   Current Outpatient Rx  Name  Route  Sig  Dispense  Refill  . DIAZEPAM 5 MG PO TABS   Oral   Take 1 tablet (5 mg total) by mouth 2 (two) times daily.   10 tablet   0   . HYDROCODONE-ACETAMINOPHEN 7.5-325 MG PO TABS   Oral   Take 1 tablet by mouth every 6 (six) hours as needed. Pain         . LEVOTHYROXINE SODIUM 150 MCG PO TABS   Oral   Take 150 mcg by mouth daily.         Marland Kitchen LISINOPRIL 10 MG PO TABS   Oral   Take 10 mg by mouth daily.         Marland Kitchen NAPROXEN 500 MG PO TABS  Oral   Take 1 tablet (500 mg total) by mouth 2 (two) times daily with a meal.   30 tablet   0   . OXYCODONE-ACETAMINOPHEN 5-325 MG PO TABS   Oral   Take 1 tablet by mouth every 4 (four) hours as needed for pain.   20 tablet   0   . PREDNISONE 10 MG PO TABS   Oral   Take 10 mg by mouth daily.         hydrocodone Prednisone  Lisinopril   BP 166/108  Pulse 97  Temp 97.9 F (36.6 C) (Oral)  Resp 18  Ht 5\' 11"  (1.803 m)  Wt 170 lb (77.111 kg)  BMI 23.71 kg/m2  SpO2 99%  Vital signs normal except hypertension   Physical Exam  Nursing note and vitals reviewed. Constitutional: He is oriented to person, place, and time. He appears well-developed and well-nourished.  Non-toxic appearance. He does not appear ill. No distress.       Patient walking with a limp  HENT:  Head: Normocephalic and  atraumatic.  Right Ear: External ear normal.  Left Ear: External ear normal.  Nose: Nose normal. No mucosal edema or rhinorrhea.  Mouth/Throat: Oropharynx is clear and moist and mucous membranes are normal. No dental abscesses or uvula swelling.  Eyes: Conjunctivae normal and EOM are normal. Pupils are equal, round, and reactive to light.  Neck: Normal range of motion and full passive range of motion without pain. Neck supple.  Cardiovascular: Normal rate, regular rhythm and normal heart sounds.  Exam reveals no gallop and no friction rub.   No murmur heard. Pulmonary/Chest: Effort normal and breath sounds normal. No respiratory distress. He has no wheezes. He has no rhonchi. He has no rales. He exhibits no tenderness and no crepitus.  Abdominal: Soft. Normal appearance and bowel sounds are normal. He exhibits no distension. There is no tenderness. There is no rebound and no guarding.  Musculoskeletal: Normal range of motion. He exhibits no edema and no tenderness.       Moves all extremities well. Nontender in his lumbar spine. He indicates his pain is in the left buttock and is tender to palpation. Patellar reflexes 2+ and equal, on straight leg raising he only has pain in his thigh on the left.  Neurological: He is alert and oriented to person, place, and time. He has normal strength. No cranial nerve deficit.  Skin: Skin is warm, dry and intact. No rash noted. No erythema. No pallor.  Psychiatric: He has a normal mood and affect. His speech is normal and behavior is normal. His mood appears not anxious.    ED Course  Procedures (including critical care time)   Medications  HYDROmorphone (DILAUDID) injection 2 mg (not administered)  dexamethasone (DECADRON) injection 10 mg (not administered)  levothyroxine (SYNTHROID, LEVOTHROID) 150 MCG tablet (not administered)  lisinopril (PRINIVIL,ZESTRIL) 10 MG tablet (not administered)  HYDROcodone-acetaminophen (NORCO) 7.5-325 MG per tablet (not  administered)  predniSONE (DELTASONE) 10 MG tablet (not administered)   I have discussed with patient that he has are on all medications I would normally prescribe for this problem. He was given IM pain medicine to help with his pain tonight. We discussed he needs MRIs of his spine and is being scheduled as an outpatient tomorrow. Patient works day shift and gets off work at 3 pm requests that the test be done after 5 PM.  Results for orders placed during the hospital encounter of 08/28/12  POCT I-STAT, CHEM  8      Component Value Range   Sodium 140  135 - 145 mEq/L   Potassium 4.2  3.5 - 5.1 mEq/L   Chloride 104  96 - 112 mEq/L   BUN 13  6 - 23 mg/dL   Creatinine, Ser 1.61  0.50 - 1.35 mg/dL   Glucose, Bld 096 (*) 70 - 99 mg/dL   Calcium, Ion 0.45 (*) 1.12 - 1.23 mmol/L   TCO2 25  0 - 100 mmol/L   Hemoglobin 15.3  13.0 - 17.0 g/dL   HCT 40.9  81.1 - 91.4 %   Laboratory interpretation all normal        1. Sciatica of left side   2. Lumbar radiculopathy     Plan discharge to return tomorrow at 5:30 pm for outpatient MR of lumbar spine  Devoria Albe, MD, FACEP   MDM          Ward Givens, MD 08/28/12 2118  Ward Givens, MD 08/28/12 2137  Ward Givens, MD 08/28/12 7829  Ward Givens, MD 08/28/12 2207

## 2012-08-28 NOTE — ED Notes (Signed)
Pt was able to ambulate in triage room

## 2012-08-28 NOTE — ED Notes (Signed)
Pt with left hip pain that radiates all way down left leg, pt seen PCP x 2 per pt for same, denies any injury

## 2012-08-29 ENCOUNTER — Other Ambulatory Visit (HOSPITAL_COMMUNITY): Payer: Self-pay | Admitting: Emergency Medicine

## 2012-08-29 ENCOUNTER — Ambulatory Visit (HOSPITAL_COMMUNITY)
Admit: 2012-08-29 | Discharge: 2012-08-29 | Disposition: A | Discharge status: Home / Self Care | Payer: BC Managed Care – PPO | Attending: Emergency Medicine | Admitting: Emergency Medicine

## 2012-08-29 DIAGNOSIS — M79605 Pain in left leg: Secondary | ICD-10-CM

## 2012-08-29 DIAGNOSIS — M5137 Other intervertebral disc degeneration, lumbosacral region: Secondary | ICD-10-CM | POA: Insufficient documentation

## 2012-08-29 DIAGNOSIS — M51379 Other intervertebral disc degeneration, lumbosacral region without mention of lumbar back pain or lower extremity pain: Secondary | ICD-10-CM | POA: Insufficient documentation

## 2012-09-30 ENCOUNTER — Ambulatory Visit (HOSPITAL_COMMUNITY)
Admission: RE | Admit: 2012-09-30 | Discharge: 2012-09-30 | Disposition: A | Discharge status: Home / Self Care | Payer: BC Managed Care – PPO | Source: Ambulatory Visit | Attending: Orthopedic Surgery | Admitting: Orthopedic Surgery

## 2012-09-30 ENCOUNTER — Ambulatory Visit (HOSPITAL_COMMUNITY): Admission: RE | Admit: 2012-09-30 | Payer: BC Managed Care – PPO | Source: Ambulatory Visit

## 2012-09-30 ENCOUNTER — Encounter (HOSPITAL_COMMUNITY)
Admission: RE | Admit: 2012-09-30 | Discharge: 2012-09-30 | Disposition: A | Discharge status: Home / Self Care | Payer: BC Managed Care – PPO | Source: Ambulatory Visit | Attending: Orthopedic Surgery | Admitting: Orthopedic Surgery

## 2012-09-30 ENCOUNTER — Other Ambulatory Visit (HOSPITAL_COMMUNITY): Payer: Self-pay | Admitting: Orthopedic Surgery

## 2012-09-30 ENCOUNTER — Ambulatory Visit (HOSPITAL_COMMUNITY)
Admission: RE | Admit: 2012-09-30 | Discharge: 2012-09-30 | Disposition: A | Discharge status: Home / Self Care | Payer: BC Managed Care – PPO | Source: Ambulatory Visit | Attending: Surgical | Admitting: Surgical

## 2012-09-30 ENCOUNTER — Encounter (HOSPITAL_COMMUNITY): Payer: Self-pay | Admitting: Pharmacy Technician

## 2012-09-30 ENCOUNTER — Encounter (HOSPITAL_COMMUNITY): Payer: Self-pay

## 2012-09-30 DIAGNOSIS — M5126 Other intervertebral disc displacement, lumbar region: Secondary | ICD-10-CM | POA: Insufficient documentation

## 2012-09-30 DIAGNOSIS — Z01810 Encounter for preprocedural cardiovascular examination: Secondary | ICD-10-CM | POA: Insufficient documentation

## 2012-09-30 DIAGNOSIS — M5137 Other intervertebral disc degeneration, lumbosacral region: Secondary | ICD-10-CM | POA: Insufficient documentation

## 2012-09-30 DIAGNOSIS — Z01812 Encounter for preprocedural laboratory examination: Secondary | ICD-10-CM | POA: Insufficient documentation

## 2012-09-30 DIAGNOSIS — M51379 Other intervertebral disc degeneration, lumbosacral region without mention of lumbar back pain or lower extremity pain: Secondary | ICD-10-CM | POA: Insufficient documentation

## 2012-09-30 HISTORY — DX: Hypothyroidism, unspecified: E03.9

## 2012-09-30 HISTORY — DX: Unspecified osteoarthritis, unspecified site: M19.90

## 2012-09-30 LAB — COMPREHENSIVE METABOLIC PANEL
ALT: 24 U/L (ref 0–53)
AST: 21 U/L (ref 0–37)
Albumin: 3.6 g/dL (ref 3.5–5.2)
Alkaline Phosphatase: 54 U/L (ref 39–117)
BUN: 19 mg/dL (ref 6–23)
CO2: 27 mEq/L (ref 19–32)
Calcium: 9.8 mg/dL (ref 8.4–10.5)
Chloride: 103 mEq/L (ref 96–112)
Creatinine, Ser: 0.98 mg/dL (ref 0.50–1.35)
GFR calc Af Amer: >90 mL/min (ref >90)
GFR calc non Af Amer: >90 mL/min (ref >90)
Glucose, Bld: 72 mg/dL (ref 70–99)
Potassium: 3.6 mEq/L (ref 3.5–5.1)
Sodium: 141 mEq/L (ref 135–145)
Total Bilirubin: 0.3 mg/dL (ref 0.3–1.2)
Total Protein: 7.5 g/dL (ref 6.0–8.3)

## 2012-09-30 LAB — URINALYSIS, ROUTINE W REFLEX MICROSCOPIC
Bilirubin Urine: NEGATIVE
Glucose, UA: NEGATIVE mg/dL
Ketones, ur: NEGATIVE mg/dL
Leukocytes, UA: NEGATIVE
Nitrite: NEGATIVE
Protein, ur: NEGATIVE mg/dL
Specific Gravity, Urine: 1.026 (ref 1.005–1.030)
Urobilinogen, UA: 0.2 mg/dL (ref 0.0–1.0)
pH: 5.5 (ref 5.0–8.0)

## 2012-09-30 LAB — CBC
HCT: 39.3 % (ref 39.0–52.0)
Hemoglobin: 13 g/dL (ref 13.0–17.0)
RBC: 4.23 MIL/uL (ref 4.22–5.81)
RDW: 15.2 % (ref 11.5–15.5)
WBC: 5.9 10*3/uL (ref 4.0–10.5)

## 2012-09-30 LAB — URINE MICROSCOPIC-ADD ON

## 2012-09-30 LAB — PROTIME-INR
INR: 0.96 (ref 0.00–1.49)
Prothrombin Time: 12.7 seconds (ref 11.6–15.2)

## 2012-09-30 LAB — SURGICAL PCR SCREEN: Staphylococcus aureus: POSITIVE — AB

## 2012-09-30 LAB — APTT: aPTT: 29 seconds (ref 24–37)

## 2012-09-30 NOTE — Progress Notes (Signed)
Need orders in epic asap pre op is today 130 pm.

## 2012-09-30 NOTE — Progress Notes (Signed)
CHEST XRAY RESULTS 09-30-12 ROUTED TO DR GIOFFRE FAX BY EPIC

## 2012-09-30 NOTE — Patient Instructions (Addendum)
[address]  09/30/2012   Your procedure is scheduled on : 10-07-2012  Report to [facility] at 100 PM.  Call this number if you have problems the morning of surgery [phone]   Remember:   Do not eat food:After Midnight.  Clear liquids midnight until 1030 am day of surgery, then nothing by mouth   Take these medicines the morning of surgery with A SIP OF WATER: levothryoxine, oxycodone if needed   Do not wear jewelry, make-up or nail polish.  Do not wear lotions, powders, or perfumes. You may wear deodorant.  Do not shave 48 hours prior to surgery. Men may shave face and neck.  Do not bring valuables to the hospital.  Contacts, dentures or bridgework may not be worn into surgery.  Leave suitcase in the car. After surgery it may be brought to your room.  For patients admitted to the hospital, checkout time is 11:00 AM the day of discharge.   Patients discharged the day of surgery will not be allowed to drive home.  Name and phone number of your driver:  Special Instructions: N/A   Please read over the following fact sheets that you were given: MRSA Information.  Call Cain Sieve RN pre op nurse if needed [phone]    FAILURE TO FOLLOW THESE INSTRUCTIONS MAY RESULT IN THE CANCELLATION OF YOUR SURGERY. PATIENT SIGNATURE___________________________________________

## 2012-10-05 NOTE — H&P (Signed)
[name] DOB: 1961-01-26  Chief Complaint: back pain  History of Present Illness The patient is a 52 year old male who presented with back pain. The patient is here today left hip and leg pain. Symptoms including pain in the left leg and numbness down the left leg and into his toes which began 2 months ago without any known injury. The patient describes the severity of their symptoms as severe. The patient feels as if the symptoms are worsening. Symptoms are exacerbated by standing and direct pressure, while symptoms are not exacerbated by sitting. Symptoms are somewhat relieved by rest and opioid analgesics, while symptoms are not relieved by cold packs or heat packs. Prior to being seen today the patient was previously evaluated by a primary physician and then went to the ER on his own for xrays. Past evaluation has included x-ray of the lumbar spine. Past treatment has included nonsteroidal anti-inflammatory drugs (Naprosyn), opioid analgesics and he got two injections into the buttock while in the ER. He has had pain since Thanksgiving.   Medical History Thyroid Disease Hypertension Chronic Pain  Allergies No Known Drug Allergies.    Family History Diabetes Mellitus. child   Social History Alcohol use. current drinker; drinks beer; less than 5 per week Children. 2 Current work status. working full time Living situation. live alone Marital status. single Tobacco / smoke exposure. yes Tobacco use. current every day smoker; smoke(d) less than 1/2 pack(s) per day; uses less than half 1/2 can(s) smokeless per week   Medication History Lisinopril (20MG  Tablet, Oral) Active. Naproxen Sodium (500MG  Tablet ER, Oral two times daily) Active. Oxycodone-Acetaminophen 10-650 MG Tablet, Oral Active.  Synthroid tablet daily   Past Surgical History Cervical Spine Disc Surgery Thyroidectomy; Total    Review of Systems General:Present- Night  Sweats. Not Present- Chills, Fever, Appetite Loss, Fatigue, Feeling sick, Weight Gain and Weight Loss. Skin:Not Present- Itching, Rash, Skin Color Changes, Ulcer, Psoriasis and Change in Hair or Nails. HEENT:Present- Nose Bleed. Not Present- Sensitivity to light, Hearing problems and Ringing in the Ears. Neck:Not Present- Swollen Glands and Neck Mass. Respiratory:Present- Chronic Cough. Not Present- Snoring, Bloody sputum and Dyspnea. Cardiovascular:Present- Leg Cramps. Not Present- Shortness of Breath, Chest Pain, Swelling of Extremities and Palpitations. Gastrointestinal:Not Present- Bloody Stool, Heartburn, Abdominal Pain, Vomiting, Nausea and Incontinence of Stool. Male Genitourinary:Not Present- Blood in Urine, Frequency, Incontinence and Nocturia. Musculoskeletal:Present- Muscle Weakness, Back Pain, and Muscle Pain. Not Present- Joint Stiffness, Joint Swelling, and Joint Pain. Neurological:Present- Tingling, Numbness and Burning. Not Present- Tremor, Headaches and Dizziness. Psychiatric:Not Present- Anxiety, Depression and Memory Loss. Endocrine:Not Present- Cold Intolerance, Heat Intolerance, Excessive hunger and Excessive Thirst. Hematology:Not Present- Abnormal Bleeding, Anemia, Blood Clots and Easy Bruising.   Vitals Weight: 173 lb Height: 71 in Body Surface Area: 1.98 m Body Mass Index: 24.13 kg/m Pulse: 80 (Regular) BP: 184/125(did not take BP meds)    Physical Exam He has a positive straight leg raise on the left. He has marked weakness of the dorsiflexors and toe extensors on the left. He has a good dorsalis pedis pulse. Surprisingly, his sensory exam is intact. He has some mild weakness of his hip flexors on the left. The back is painful in all ranges. Popliteal spaces are fine. The knees are normal. Hips are negative. Lungs are clear to auscultation. The heart is normal with a normal sinus rhythm but no murmur. Oral cavity shows poor dentition. There  are no lesions in the oral cavity. The neck is supple,  no bruits. Abdomen soft and nontender. Bowel sounds active.     RADIOGRAPHS: Plain films are taken and show a marked degenerative disk disease at L4-5, L5-S1. There is severe foraminal stenosis at L4-5. Reviewed the MRI and he has a very impressive disc herniation at L4-5 on the left.   Assessments  He has a herniated lumbar disc at L4-5 on the left. Foraminal stenosis at L4-5 on the left. He has a foot drop on the left.   Plan  He needs a hemilaminectomy, microdiscectomy at L4-5 on the left with a foraminotomy. Dr. Darrelyn Hillock did explain to him the importance of the surgery because of the foot drop that he has. There is no guarantee that will come back, but it should. The possible complications of spinal surgery number one could be infection, which is extremely rare. We do use antibiotics prior to the surgery and during surgery and after surgery. Number two is always a slight degree of probability that you could develop a blood clot in your leg after any type of surgery and we try our best to prevent that with aspirin post op when it is safe to begin. The third is a dural leak. That is the spinal fluid leak that could occur. At certain rare times the bone or the disc could literally stick to the dura which is the lining which contains the spinal fluid and we could develop a small tear in that lining which we then patch up. That is an extremely rare complication. The last and final complication is a recurrent disc rupture. That means that you could rupture another small piece of disc later on down the road and there is about a 2% chance of that.    Dimitri Ped, PA-C

## 2012-10-07 ENCOUNTER — Ambulatory Visit (HOSPITAL_COMMUNITY): Payer: BC Managed Care – PPO

## 2012-10-07 ENCOUNTER — Ambulatory Visit (HOSPITAL_COMMUNITY): Payer: BC Managed Care – PPO | Admitting: Anesthesiology

## 2012-10-07 ENCOUNTER — Encounter (HOSPITAL_COMMUNITY): Payer: Self-pay | Admitting: Anesthesiology

## 2012-10-07 ENCOUNTER — Encounter (HOSPITAL_COMMUNITY)
Admission: RE | Disposition: A | Discharge status: Home / Self Care | Payer: Self-pay | Source: Ambulatory Visit | Attending: Orthopedic Surgery

## 2012-10-07 ENCOUNTER — Encounter (HOSPITAL_COMMUNITY): Payer: Self-pay | Admitting: *Deleted

## 2012-10-07 ENCOUNTER — Observation Stay (HOSPITAL_COMMUNITY)
Admission: RE | Admit: 2012-10-07 | Discharge: 2012-10-08 | Disposition: A | Discharge status: Home / Self Care | Payer: BC Managed Care – PPO | Source: Ambulatory Visit | Attending: Orthopedic Surgery | Admitting: Orthopedic Surgery

## 2012-10-07 DIAGNOSIS — Z79899 Other long term (current) drug therapy: Secondary | ICD-10-CM | POA: Insufficient documentation

## 2012-10-07 DIAGNOSIS — M216X9 Other acquired deformities of unspecified foot: Secondary | ICD-10-CM | POA: Insufficient documentation

## 2012-10-07 DIAGNOSIS — Z9889 Other specified postprocedural states: Secondary | ICD-10-CM

## 2012-10-07 DIAGNOSIS — M5126 Other intervertebral disc displacement, lumbar region: Principal | ICD-10-CM | POA: Diagnosis present

## 2012-10-07 DIAGNOSIS — M48062 Spinal stenosis, lumbar region with neurogenic claudication: Secondary | ICD-10-CM | POA: Diagnosis present

## 2012-10-07 DIAGNOSIS — E079 Disorder of thyroid, unspecified: Secondary | ICD-10-CM | POA: Insufficient documentation

## 2012-10-07 DIAGNOSIS — M5137 Other intervertebral disc degeneration, lumbosacral region: Secondary | ICD-10-CM | POA: Insufficient documentation

## 2012-10-07 DIAGNOSIS — M51379 Other intervertebral disc degeneration, lumbosacral region without mention of lumbar back pain or lower extremity pain: Secondary | ICD-10-CM | POA: Insufficient documentation

## 2012-10-07 DIAGNOSIS — I10 Essential (primary) hypertension: Secondary | ICD-10-CM | POA: Insufficient documentation

## 2012-10-07 DIAGNOSIS — F17200 Nicotine dependence, unspecified, uncomplicated: Secondary | ICD-10-CM | POA: Insufficient documentation

## 2012-10-07 HISTORY — PX: HEMI-MICRODISCECTOMY LUMBAR LAMINECTOMY LEVEL 1: SHX5846

## 2012-10-07 SURGERY — HEMI-MICRODISCECTOMY LUMBAR LAMINECTOMY LEVEL 1
Anesthesia: General | Laterality: Left | Wound class: Clean

## 2012-10-07 MED ORDER — MENTHOL 3 MG MT LOZG
1.0000 | LOZENGE | OROMUCOSAL | Status: DC | PRN
  Filled 2012-10-07: qty 9

## 2012-10-07 MED ORDER — ONDANSETRON HCL 4 MG/2ML IJ SOLN: 4.0000 mg | INTRAMUSCULAR | Status: DC | PRN

## 2012-10-07 MED ORDER — OXYCODONE HCL 5 MG/5ML PO SOLN
5.0000 mg | Freq: Once | ORAL | Status: DC | PRN
  Filled 2012-10-07: qty 5

## 2012-10-07 MED ORDER — LACTATED RINGERS IV SOLN
INTRAVENOUS | Status: DC
  Administered 2012-10-07 (×2): via INTRAVENOUS

## 2012-10-07 MED ORDER — PROPOFOL 10 MG/ML IV BOLUS
INTRAVENOUS | Status: DC | PRN
  Administered 2012-10-07: 150 mg via INTRAVENOUS

## 2012-10-07 MED ORDER — BUPIVACAINE LIPOSOME 1.3 % IJ SUSP
20.0000 mL | Freq: Once | INTRAMUSCULAR | Status: DC
  Filled 2012-10-07: qty 20

## 2012-10-07 MED ORDER — PHENYLEPHRINE HCL 10 MG/ML IJ SOLN
INTRAMUSCULAR | Status: DC | PRN
  Administered 2012-10-07 (×2): 40 ug via INTRAVENOUS
  Administered 2012-10-07: 120 ug via INTRAVENOUS
  Administered 2012-10-07: 40 ug via INTRAVENOUS
  Administered 2012-10-07 (×2): 80 ug via INTRAVENOUS

## 2012-10-07 MED ORDER — THROMBIN 5000 UNITS EX SOLR
CUTANEOUS | Status: DC | PRN
  Administered 2012-10-07: 10000 [IU] via TOPICAL

## 2012-10-07 MED ORDER — FLEET ENEMA 7-19 GM/118ML RE ENEM: 1.0000 | ENEMA | Freq: Once | RECTAL | Status: AC | PRN

## 2012-10-07 MED ORDER — LEVOTHYROXINE SODIUM 150 MCG PO TABS
150.0000 ug | ORAL_TABLET | Freq: Every day | ORAL | Status: DC
  Administered 2012-10-08: 150 ug via ORAL
  Filled 2012-10-07 (×3): qty 1

## 2012-10-07 MED ORDER — OXYCODONE HCL 5 MG PO TABS: 5.0000 mg | ORAL_TABLET | Freq: Once | ORAL | Status: DC | PRN

## 2012-10-07 MED ORDER — BUPIVACAINE-EPINEPHRINE PF 0.25-1:200000 % IJ SOLN
INTRAMUSCULAR | Status: AC
  Filled 2012-10-07: qty 30

## 2012-10-07 MED ORDER — HYDROMORPHONE HCL PF 1 MG/ML IJ SOLN: 0.2500 mg | INTRAMUSCULAR | Status: DC | PRN

## 2012-10-07 MED ORDER — SODIUM CHLORIDE 0.9 % IR SOLN
Status: DC | PRN
  Administered 2012-10-07: 17:00:00

## 2012-10-07 MED ORDER — POLYETHYLENE GLYCOL 3350 17 G PO PACK: 17.0000 g | PACK | Freq: Every day | ORAL | Status: DC | PRN

## 2012-10-07 MED ORDER — HYDROCODONE-ACETAMINOPHEN 5-325 MG PO TABS: 1.0000 | ORAL_TABLET | ORAL | Status: DC | PRN

## 2012-10-07 MED ORDER — METOCLOPRAMIDE HCL 5 MG/ML IJ SOLN
INTRAMUSCULAR | Status: DC | PRN
  Administered 2012-10-07: 10 mg via INTRAVENOUS

## 2012-10-07 MED ORDER — MIDAZOLAM HCL 5 MG/5ML IJ SOLN
INTRAMUSCULAR | Status: DC | PRN
  Administered 2012-10-07: 2 mg via INTRAVENOUS

## 2012-10-07 MED ORDER — BUPIVACAINE LIPOSOME 1.3 % IJ SUSP
INTRAMUSCULAR | Status: DC | PRN
  Administered 2012-10-07: 20 mL

## 2012-10-07 MED ORDER — OXYCODONE-ACETAMINOPHEN 5-325 MG PO TABS
1.0000 | ORAL_TABLET | ORAL | Status: DC | PRN
Start: 2012-10-07 — End: 2012-10-08
  Administered 2012-10-08 (×2): 2 via ORAL
  Filled 2012-10-07 (×2): qty 2

## 2012-10-07 MED ORDER — GLYCOPYRROLATE 0.2 MG/ML IJ SOLN
INTRAMUSCULAR | Status: DC | PRN
  Administered 2012-10-07: 0.6 mg via INTRAVENOUS

## 2012-10-07 MED ORDER — PHENYLEPHRINE HCL 10 MG/ML IJ SOLN
10.0000 mg | INTRAVENOUS | Status: DC | PRN
  Administered 2012-10-07: 20 ug/min via INTRAVENOUS

## 2012-10-07 MED ORDER — BACITRACIN-NEOMYCIN-POLYMYXIN 400-5-5000 EX OINT
TOPICAL_OINTMENT | CUTANEOUS | Status: DC | PRN
  Administered 2012-10-07: 1 via TOPICAL

## 2012-10-07 MED ORDER — METHOCARBAMOL 500 MG PO TABS: 500.0000 mg | ORAL_TABLET | Freq: Four times a day (QID) | ORAL | Status: DC | PRN

## 2012-10-07 MED ORDER — HYDROMORPHONE HCL PF 1 MG/ML IJ SOLN
0.5000 mg | INTRAMUSCULAR | Status: DC | PRN
Start: 2012-10-07 — End: 2012-10-08

## 2012-10-07 MED ORDER — ONDANSETRON HCL 4 MG/2ML IJ SOLN
INTRAMUSCULAR | Status: DC | PRN
  Administered 2012-10-07: 4 mg via INTRAVENOUS

## 2012-10-07 MED ORDER — HEMOSTATIC AGENTS (NO CHARGE) OPTIME
TOPICAL | Status: DC | PRN
  Administered 2012-10-07: 1 via TOPICAL

## 2012-10-07 MED ORDER — CEFAZOLIN SODIUM-DEXTROSE 2-3 GM-% IV SOLR
2.0000 g | INTRAVENOUS | Status: AC
  Administered 2012-10-07: 2 g via INTRAVENOUS

## 2012-10-07 MED ORDER — MEPERIDINE HCL 50 MG/ML IJ SOLN: 6.2500 mg | INTRAMUSCULAR | Status: DC | PRN

## 2012-10-07 MED ORDER — PHENOL 1.4 % MT LIQD: 1.0000 | OROMUCOSAL | Status: DC | PRN

## 2012-10-07 MED ORDER — CEFAZOLIN SODIUM-DEXTROSE 2-3 GM-% IV SOLR
INTRAVENOUS | Status: AC
  Filled 2012-10-07: qty 50

## 2012-10-07 MED ORDER — OXYCODONE-ACETAMINOPHEN 5-325 MG PO TABS: 1.0000 | ORAL_TABLET | Freq: Four times a day (QID) | ORAL | Status: DC | PRN

## 2012-10-07 MED ORDER — LISINOPRIL 10 MG PO TABS
10.0000 mg | ORAL_TABLET | Freq: Every morning | ORAL | Status: DC
  Administered 2012-10-08: 10 mg via ORAL
  Filled 2012-10-07: qty 1

## 2012-10-07 MED ORDER — ACETAMINOPHEN 10 MG/ML IV SOLN: 1000.0000 mg | Freq: Once | INTRAVENOUS | Status: DC | PRN

## 2012-10-07 MED ORDER — DEXAMETHASONE SODIUM PHOSPHATE 4 MG/ML IJ SOLN
INTRAMUSCULAR | Status: DC | PRN
  Administered 2012-10-07: 10 mg via INTRAVENOUS

## 2012-10-07 MED ORDER — ACETAMINOPHEN 10 MG/ML IV SOLN
INTRAVENOUS | Status: DC | PRN
  Administered 2012-10-07: 1000 mg via INTRAVENOUS

## 2012-10-07 MED ORDER — THROMBIN 5000 UNITS EX SOLR
CUTANEOUS | Status: AC
  Filled 2012-10-07: qty 10000

## 2012-10-07 MED ORDER — PROMETHAZINE HCL 25 MG/ML IJ SOLN: 6.2500 mg | INTRAMUSCULAR | Status: DC | PRN

## 2012-10-07 MED ORDER — ROCURONIUM BROMIDE 100 MG/10ML IV SOLN
INTRAVENOUS | Status: DC | PRN
  Administered 2012-10-07: 50 mg via INTRAVENOUS

## 2012-10-07 MED ORDER — BACITRACIN ZINC 500 UNIT/GM EX OINT
TOPICAL_OINTMENT | CUTANEOUS | Status: AC
  Filled 2012-10-07: qty 15

## 2012-10-07 MED ORDER — FENTANYL CITRATE 0.05 MG/ML IJ SOLN
INTRAMUSCULAR | Status: DC | PRN
  Administered 2012-10-07 (×6): 50 ug via INTRAVENOUS

## 2012-10-07 MED ORDER — LACTATED RINGERS IV SOLN
INTRAVENOUS | Status: DC
  Administered 2012-10-08: 03:00:00 via INTRAVENOUS

## 2012-10-07 MED ORDER — NEOSTIGMINE METHYLSULFATE 1 MG/ML IJ SOLN
INTRAMUSCULAR | Status: DC | PRN
  Administered 2012-10-07: 5 mg via INTRAVENOUS

## 2012-10-07 MED ORDER — ACETAMINOPHEN 10 MG/ML IV SOLN
INTRAVENOUS | Status: AC
  Filled 2012-10-07: qty 100

## 2012-10-07 MED ORDER — METHOCARBAMOL 100 MG/ML IJ SOLN
500.0000 mg | Freq: Four times a day (QID) | INTRAVENOUS | Status: DC | PRN
  Administered 2012-10-07: 500 mg via INTRAVENOUS
  Filled 2012-10-07: qty 5

## 2012-10-07 MED ORDER — BISACODYL 10 MG RE SUPP: 10.0000 mg | Freq: Every day | RECTAL | Status: DC | PRN

## 2012-10-07 SURGICAL SUPPLY — 52 items
APL SKNCLS STERI-STRIP NONHPOA (GAUZE/BANDAGES/DRESSINGS) ×1
BAG SPEC THK2 15X12 ZIP CLS (MISCELLANEOUS) ×1
BAG ZIPLOCK 12X15 (MISCELLANEOUS) ×2 IMPLANT
BENZOIN TINCTURE PRP APPL 2/3 (GAUZE/BANDAGES/DRESSINGS) ×2 IMPLANT
CLEANER TIP ELECTROSURG 2X2 (MISCELLANEOUS) ×2 IMPLANT
CLOTH BEACON ORANGE TIMEOUT ST (SAFETY) ×2 IMPLANT
CONT SPEC 4OZ CLIKSEAL STRL BL (MISCELLANEOUS) ×1 IMPLANT
CONT SPECI 4OZ STER CLIK (MISCELLANEOUS) ×2 IMPLANT
DECANTER SPIKE VIAL GLASS SM (MISCELLANEOUS) ×1 IMPLANT
DRAIN PENROSE 18X1/4 LTX STRL (WOUND CARE) IMPLANT
DRAPE MICROSCOPE LEICA (MISCELLANEOUS) ×2 IMPLANT
DRAPE POUCH INSTRU U-SHP 10X18 (DRAPES) ×2 IMPLANT
DRAPE SURG 17X11 SM STRL (DRAPES) ×2 IMPLANT
DRSG ADAPTIC 3X8 NADH LF (GAUZE/BANDAGES/DRESSINGS) ×2 IMPLANT
DRSG PAD ABDOMINAL 8X10 ST (GAUZE/BANDAGES/DRESSINGS) ×2 IMPLANT
DURAPREP 26ML APPLICATOR (WOUND CARE) ×2 IMPLANT
ELECT BLADE TIP CTD 4 INCH (ELECTRODE) ×1 IMPLANT
ELECT REM PT RETURN 9FT ADLT (ELECTROSURGICAL) ×2
ELECTRODE REM PT RTRN 9FT ADLT (ELECTROSURGICAL) ×1 IMPLANT
GLOVE BIOGEL PI IND STRL 8 (GLOVE) ×1 IMPLANT
GLOVE BIOGEL PI IND STRL 8.5 (GLOVE) ×1 IMPLANT
GLOVE BIOGEL PI INDICATOR 8 (GLOVE) ×1
GLOVE BIOGEL PI INDICATOR 8.5 (GLOVE) ×1
GLOVE ECLIPSE 8.0 STRL XLNG CF (GLOVE) ×4 IMPLANT
GOWN PREVENTION PLUS LG XLONG (DISPOSABLE) ×2 IMPLANT
GOWN STRL REIN XL XLG (GOWN DISPOSABLE) ×6 IMPLANT
KIT BASIN OR (CUSTOM PROCEDURE TRAY) ×2 IMPLANT
KIT POSITIONING SURG ANDREWS (MISCELLANEOUS) ×2 IMPLANT
MANIFOLD NEPTUNE II (INSTRUMENTS) ×2 IMPLANT
NDL SPNL 18GX3.5 QUINCKE PK (NEEDLE) ×2 IMPLANT
NEEDLE HYPO 22GX1.5 SAFETY (NEEDLE) ×1 IMPLANT
NEEDLE SPNL 18GX3.5 QUINCKE PK (NEEDLE) ×6 IMPLANT
NS IRRIG 1000ML POUR BTL (IV SOLUTION) ×2 IMPLANT
PATTIES SURGICAL .5 X.5 (GAUZE/BANDAGES/DRESSINGS) IMPLANT
PATTIES SURGICAL .75X.75 (GAUZE/BANDAGES/DRESSINGS) IMPLANT
PATTIES SURGICAL 1X1 (DISPOSABLE) IMPLANT
PIN SAFETY NICK PLATE  2 MED (MISCELLANEOUS)
PIN SAFETY NICK PLATE 2 MED (MISCELLANEOUS) IMPLANT
POSITIONER SURGICAL ARM (MISCELLANEOUS) ×2 IMPLANT
SPONGE GAUZE 4X4 12PLY (GAUZE/BANDAGES/DRESSINGS) ×1 IMPLANT
SPONGE LAP 4X18 X RAY DECT (DISPOSABLE) IMPLANT
SPONGE SURGIFOAM ABS GEL 100 (HEMOSTASIS) ×2 IMPLANT
STAPLER VISISTAT 35W (STAPLE) IMPLANT
SUT VIC AB 0 CT1 27 (SUTURE) ×2
SUT VIC AB 0 CT1 27XBRD ANTBC (SUTURE) ×1 IMPLANT
SUT VIC AB 1 CT1 27 (SUTURE) ×8
SUT VIC AB 1 CT1 27XBRD ANTBC (SUTURE) ×4 IMPLANT
SYR 20CC LL (SYRINGE) ×1 IMPLANT
TAPE CLOTH SURG 6X10 WHT LF (GAUZE/BANDAGES/DRESSINGS) ×1 IMPLANT
TOWEL OR 17X26 10 PK STRL BLUE (TOWEL DISPOSABLE) ×4 IMPLANT
TRAY LAMINECTOMY (CUSTOM PROCEDURE TRAY) ×2 IMPLANT
WATER STERILE IRR 1500ML POUR (IV SOLUTION) ×2 IMPLANT

## 2012-10-07 NOTE — Interval H&P Note (Signed)
History and Physical Interval Note:  10/07/2012 3:46 PM  [name]  has presented today for surgery, with the diagnosis of HERNIATED DISC  The various methods of treatment have been discussed with the patient and family. After consideration of risks, benefits and other options for treatment, the patient has consented to  Procedure(s) (LRB) with comments: HEMI-MICRODISCECTOMY LUMBAR LAMINECTOMY LEVEL 1 (Left) as a surgical intervention .  The patient's history has been reviewed, patient examined, no change in status, stable for surgery.  I have reviewed the patient's chart and labs.  Questions were answered to the patient's satisfaction.     Adham Johnson A

## 2012-10-07 NOTE — Interval H&P Note (Signed)
History and Physical Interval Note:  10/07/2012 3:45 PM  [name]  has presented today for surgery, with the diagnosis of HERNIATED DISC  The various methods of treatment have been discussed with the patient and family. After consideration of risks, benefits and other options for treatment, the patient has consented to  Procedure(s) (LRB) with comments: HEMI-MICRODISCECTOMY LUMBAR LAMINECTOMY LEVEL 1 (Left) as a surgical intervention .  The patient's history has been reviewed, patient examined, no change in status, stable for surgery.  I have reviewed the patient's chart and labs.  Questions were answered to the patient's satisfaction.     [name] A

## 2012-10-07 NOTE — Anesthesia Preprocedure Evaluation (Addendum)
Anesthesia Evaluation  Patient identified by MRN, date of birth, ID band Patient awake    Reviewed: Allergy & Precautions, H&P , NPO status , Patient's Chart, lab work & pertinent test results  Airway Mallampati: II TM Distance: >3 FB Neck ROM: Full    Dental  (+) Dental Advisory Given and Missing,    Pulmonary Current Smoker,  breath sounds clear to auscultation  Pulmonary exam normal       Cardiovascular hypertension, Pt. on medications - Past MI Rhythm:Regular Rate:Normal     Neuro/Psych negative neurological ROS  negative psych ROS   GI/Hepatic negative GI ROS, Neg liver ROS,   Endo/Other  Hypothyroidism   Renal/GU negative Renal ROS     Musculoskeletal negative musculoskeletal ROS (+)   Abdominal   Peds  Hematology negative hematology ROS (+)   Anesthesia Other Findings   Reproductive/Obstetrics                         Anesthesia Physical Anesthesia Plan  ASA: II  Anesthesia Plan: General   Post-op Pain Management:    Induction: Intravenous  Airway Management Planned: Oral ETT  Additional Equipment:   Intra-op Plan:   Post-operative Plan: Extubation in OR  Informed Consent: I have reviewed the patients History and Physical, chart, labs and discussed the procedure including the risks, benefits and alternatives for the proposed anesthesia with the patient or authorized representative who has indicated his/her understanding and acceptance.   Dental advisory given  Plan Discussed with: CRNA  Anesthesia Plan Comments:         Anesthesia Quick Evaluation

## 2012-10-07 NOTE — Anesthesia Postprocedure Evaluation (Signed)
Anesthesia Post Note  Patient: [name]  Procedure(s) Performed: Procedure(s) (LRB): HEMI-MICRODISCECTOMY LUMBAR LAMINECTOMY LEVEL 1 (Left)  Anesthesia type: General  Patient location: PACU  Post pain: Pain level controlled  Post assessment: Post-op Vital signs reviewed  Last Vitals: BP 112/77  Pulse 83  Temp 36.9 C (Oral)  Resp 19  SpO2 100%  Post vital signs: Reviewed  Level of consciousness: sedated  Complications: No apparent anesthesia complications

## 2012-10-07 NOTE — Brief Op Note (Signed)
10/07/2012  5:47 PM  PATIENT:  [name]  52 y.o. male  PRE-OPERATIVE DIAGNOSIS:  HERNIATED DISC,Lumbar at L-4-L-5 on the Left and Spinal Stenosis at Same Level  POST-OPERATIVE DIAGNOSIS:  Same as Pre-Op.  PROCEDURE:  Procedure(s) (LRB) with comments: HEMI-MICRODISCECTOMY LUMBAR LAMINECTOMY LEVEL 1 (Left) at L-4-L-4 on the Left and Decompressive Lumbar Laminectomy for Spinal Stenosis, Foraminotomies for Two Nerve Roots,L-4 and L-5 on the left.  SURGEON:  Surgeon(s) and Role:    * Jacki Cones, MD - Primary    * Drucilla Schmidt, MD - Assisting    ASSISTANTS: Marlowe Kays MD   ANESTHESIA:   general  EBL:  Total I/O In: 1000 [I.V.:1000] Out: -   BLOOD ADMINISTERED:none  DRAINS: none   LOCAL MEDICATIONS USED:  BUPIVICAINE 20cc.  SPECIMEN:  Source of Specimen:  L-4-L-5  DISPOSITION OF SPECIMEN:  PATHOLOGY  COUNTS:  YES  TOURNIQUET:  * No tourniquets in log *  DICTATION: .Other Dictation: Dictation Number [id number]  PLAN OF CARE: Admit for overnight observation  PATIENT DISPOSITION:  PACU - hemodynamically stable.   Delay start of Pharmacological VTE agent (>24hrs) due to surgical blood loss or risk of bleeding: yes

## 2012-10-07 NOTE — Transfer of Care (Signed)
Immediate Anesthesia Transfer of Care Note  Patient: [name]  Procedure(s) Performed: Procedure(s) (LRB) with comments: HEMI-MICRODISCECTOMY LUMBAR LAMINECTOMY LEVEL 1 (Left)  Patient Location: PACU  Anesthesia Type:General  Level of Consciousness: awake, alert , oriented, patient cooperative and responds to stimulation  Airway & Oxygen Therapy: Patient Spontanous Breathing and Patient connected to face mask oxygen  Post-op Assessment: Report given to PACU RN, Post -op Vital signs reviewed and stable and Patient moving all extremities X 4  Post vital signs: Reviewed and stable  Complications: No apparent anesthesia complications

## 2012-10-07 NOTE — Preoperative (Signed)
Beta Blockers   Reason not to administer Beta Blockers:Not Applicable, not on home BB 

## 2012-10-08 ENCOUNTER — Observation Stay (HOSPITAL_COMMUNITY): Payer: BC Managed Care – PPO

## 2012-10-08 NOTE — Evaluation (Signed)
Physical Therapy Evaluation Patient Details Name: [name] MRN: [id number] DOB: 03-05-1961 Today's Date: 10/08/2012 Time: 6578-4696 PT Time Calculation (min): 22 min  PT Assessment / Plan / Recommendation Clinical Impression  Patient is a 52 y.o. male s/p microdiscectomy and laminectomy L4/5 who presents at independent level for mobility and after education maintains back precautions.  No further skilled PT intervention is needed at this time.  Did discuss options for outpatient PT in the future if he feels need for core strengthening.    PT Assessment  Patent does not need any further PT services    Follow Up Recommendations  No PT follow up          Equipment Recommendations  None recommended by PT          Precautions / Restrictions Precautions Precautions: Back Precaution Booklet Issued: Yes (comment) Restrictions Weight Bearing Restrictions: No   Pertinent Vitals/Pain Reports pain much improved since surgery      Mobility  Bed Mobility Details for Bed Mobility Assistance: Did not visualize mobility, but patient described independently procedure for sit<>supine maintaining back precautions Transfers Sit to Stand: 6: Modified independent (Device/Increase time);With armrests;From chair/3-in-1 Stand to Sit: 6: Modified independent (Device/Increase time);To chair/3-in-1;With armrests Details for Transfer Assistance: demonstrates procedure while maintaining back precautions Ambulation/Gait Ambulation/Gait Assistance: 7: Independent Ambulation Distance (Feet): 200 Feet Assistive device: None Ambulation/Gait Assistance Details: slight limp on left due to decreased PF strength Stairs: Yes Stairs Assistance: 5: Supervision Stairs Assistance Details (indicate cue type and reason): cues for step to technique to help with maintaining back precautions Stair Management Technique: Step to pattern;Two rails;Forwards Number of Stairs: 10       Visit Information  Last  PT Received On: 10/08/12 Assistance Needed: +1    Subjective Data  Subjective: I can feel the strength coming back in my leg   Prior Functioning  Home Living Available Help at Discharge: Family;Available PRN/intermittently Type of Home: House Home Access: Stairs to enter [employer] of Steps: 8 Entrance Stairs-Rails: Can reach both Home Layout: One level Bathroom Shower/Tub: Forensic scientist: Standard Bathroom Accessibility: Yes How Accessible: Accessible via walker Home Adaptive Equipment: Bedside commode/3-in-1;Shower chair with back;Walker - rolling;Straight cane Prior Function Level of Independence: Independent Able to Take Stairs?: Yes Driving: Yes Communication Communication: No difficulties Dominant Hand: Right    Cognition  Overall Cognitive Status: Appears within functional limits for tasks assessed/performed Arousal/Alertness: Awake/alert Orientation Level: Appears intact for tasks assessed Behavior During Session: [facility] for tasks performed    Extremity/Trunk Assessment Right Upper Extremity Assessment RUE ROM/Strength/Tone: Within functional levels Left Upper Extremity Assessment LUE ROM/Strength/Tone: Within functional levels Right Lower Extremity Assessment RLE ROM/Strength/Tone: [facility] for tasks assessed Left Lower Extremity Assessment LLE ROM/Strength/Tone: Deficits LLE ROM/Strength/Tone Deficits: decreased ankle dorsiflexion, great toe dorsiflexion and ankle plantarflexion strength as compared to right LT LLE Sensation: Deficits LLE Sensation Deficits: still slightly decreased at tip of great toe   Balance Balance Balance Assessed: Yes Dynamic Standing Balance Dynamic Standing - Balance Support: No upper extremity supported Dynamic Standing - Level of Assistance: 7: Independent Dynamic Standing - Balance Activities: Forward lean/weight shifting;Reaching for objects Dynamic Standing - Comments: standing reaching into closet  for items of clothing, using squatting to reach low to maintain back precautions  End of Session PT - End of Session Equipment Utilized During Treatment: Gait belt Activity Tolerance: Patient tolerated treatment well Patient left: in chair;with call bell/phone within reach  GP Functional Assessment Tool Used: Clinical Judgement Functional Limitation:  Mobility: Walking and moving around Mobility: Walking and Moving Around Current Status [phone]): At least 1 percent but less than 20 percent impaired, limited or restricted Mobility: Walking and Moving Around Goal Status [phone]): At least 1 percent but less than 20 percent impaired, limited or restricted Mobility: Walking and Moving Around Discharge Status [phone]): At least 1 percent but less than 20 percent impaired, limited or restricted   [facility] 10/08/2012, 9:58 AM  Sheran Lawless, PT [phone] 10/08/2012

## 2012-10-08 NOTE — Op Note (Signed)
NAMEJIBRAN, [name] NO.:  [id number]  MEDICAL RECORD NO.:  [id number]  LOCATION:  1602                         FACILITY:  [facility]  PHYSICIAN:  Georges Lynch. Savino Whisenant, M.D.DATE OF BIRTH:  1960-10-26  DATE OF PROCEDURE:  10/07/2012 DATE OF DISCHARGE:                              OPERATIVE REPORT   SURGEON:  Georges Lynch. Darrelyn Hillock, M.D.  ASSISTANT:  Marlowe Kays, M.D.  PREOPERATIVE DIAGNOSES: 1. Foraminal disk herniation with a foot drop at L4-L5 on the left. 2. Severe lateral recess stenosis at L4-L5 on the left. 3. Foraminal stenosis at L4 and L5 roots on the left.  POSTOPERATIVE DIAGNOSES: 1. Foraminal disk herniation with a foot drop at L4-L5 on the left. 2. Severe lateral recess stenosis at L4-L5 on the left. 3. Foraminal stenosis at L4 and L5 roots on the left.  OPERATION: 1. Decompressive lumbar laminectomy on the left for a severe lateral     recess stenosis at L4-L5. 2. Microdiskectomy for herniated foraminal disk at L4-L5 on the left. 3. Foraminotomies for the L4 root on the left. 4. Foraminotomy for the L5 root on the left.  PROCEDURE:  Under general anesthesia, the patient on spinal frame, a routine orthopedic prep and draping of the lower back area was carried out.  The appropriate time-out was first carried out.  I also marked the appropriate left side of the back in the holding area.  At this time, 2 needles were placed in the back for localization purposes.  X-ray was taken.  Incision then was made over the L4-L5 space.  Bleeders were identified and cauterized.  I stripped the muscle from the lamina bilaterally from the spinous processes.  Another x-ray was taken to verify the position.  Following that, the self-retaining McCullough retractors were inserted.  I then brought the microscope in and did the hemilaminectomy in the usual fashion.  We had to go far out laterally to decompress the root and the recess.  We then went distally, and did  a foraminotomy and identified the L5 root.  We then went proximally and did a foraminotomy for the L4 root.  We cauterized the lateral recess veins with a bipolar.  Following that, we gently isolated the nerve root, gently retracted the nerve with a __________.  A needle was placed and displaced, and an x-ray was taken.  We did verify the L4-L5 space. At this time, a cruciate incision was made in the posterior longitudinal ligament.  I then inserted the nerve hook and then the Epstein curettes and thoroughly decompressed the disk from the subligamentous space and out from the foramina.  We then carried out our microdiskectomy in the usual fashion until we had total freedom of the nerve roots of L4 and L5 and the dura.  We made multiple passes in the disk space.  We thoroughly irrigated out the area.  Good hemostasis was maintained.  I loosely applied some thrombin-soaked Gelfoam into the surgical site.  I closed the wound in layers in the usual fashion except I left a small deep distal and proximal part of the wound open for drainage purposes.  Subcu was closed with #1 Vicryl, skin with metal staples.  I injected 20 mL of Exparel into the soft tissue prior to closure.  A sterile Neosporin dressing was applied.  The patient had 2 g of IV Ancef preop.          ______________________________ Georges Lynch. Darrelyn Hillock, M.D.     RAG/MEDQ  D:  10/07/2012  T:  10/08/2012  Job:  [id number]

## 2012-10-08 NOTE — Evaluation (Signed)
Occupational Therapy Evaluation and Discharge Patient Details Name: BAYANI RENTERIA MRN: [id number] DOB: Jul 06, 1961 Today's Date: 10/08/2012 Time: 4098-1191 OT Time Calculation (min): 19 min  OT Assessment / Plan / Recommendation Clinical Impression  This 52 yo male s/p HEMI-MICRODISCECTOMY LUMBAR LAMINECTOMY LEVEL 1 (Left) at L-4-L-4 on the Left and Decompressive Lumbar Laminectomy for Spinal Stenosis, Foraminotomies for Two Nerve Roots,L-4 and L-5 on the left presents to acute OT with all education completed. Will D/C from acute OT.    OT Assessment  Patient does not need any further OT services    Follow Up Recommendations  No OT follow up       Equipment Recommendations  None recommended by OT          Precautions / Restrictions Precautions Precautions: Back Precaution Booklet Issued: Yes (comment) Restrictions Weight Bearing Restrictions: No   Pertinent Vitals/Pain 3/10 back    ADL  Transfers/Ambulation Related to ADLs: Mod I for sit to stand and stand to sit, I with ambulation ADL Comments: Pt I/Mod I with all BADLs. Can cross legs to get to feet while seated. Demonstrated to him how to side step into a tub by simulating by side stepping over a trashcan turned on its side        Visit Information  Last OT Received On: 10/08/12 Assistance Needed: +1    Subjective Data  Subjective: Before the surgery I was bent over walking, it feels good to stand up straight   Prior Functioning     Home Living Available Help at Discharge: Family;Available PRN/intermittently Type of Home: House Home Access: Stairs to enter [employer] of Steps: 8 Entrance Stairs-Rails: Can reach both Home Layout: One level Bathroom Shower/Tub: Forensic scientist: Standard Bathroom Accessibility: Yes How Accessible: Accessible via walker Home Adaptive Equipment: Bedside commode/3-in-1;Shower chair with back;Walker - rolling;Straight cane Prior  Function Level of Independence: Independent Able to Take Stairs?: Yes Driving: Yes Communication Communication: No difficulties Dominant Hand: Right            Cognition  Overall Cognitive Status: Appears within functional limits for tasks assessed/performed Arousal/Alertness: Awake/alert Orientation Level: Appears intact for tasks assessed Behavior During Session: [facility] for tasks performed    Extremity/Trunk Assessment Right Upper Extremity Assessment RUE ROM/Strength/Tone: Within functional levels Left Upper Extremity Assessment LUE ROM/Strength/Tone: Within functional levels     Mobility Bed Mobility Details for Bed Mobility Assistance: Pt just getting back from CT so he was in a W/C upon my arrival Transfers Transfers: Sit to Stand;Stand to Sit Sit to Stand: 6: Modified independent (Device/Increase time);With upper extremity assist;With armrests;From chair/3-in-1 Stand to Sit: 6: Modified independent (Device/Increase time);With upper extremity assist;With armrests;To chair/3-in-1 Details for Transfer Assistance: No cues needed for correct hand placement              End of Session OT - End of Session Equipment Utilized During Treatment:  (None) Activity Tolerance: Patient tolerated treatment well Patient left: in chair;with call bell/phone within reach  GO Functional Assessment Tool Used: Clinical judgement Functional Limitation: Self care Self Care Current Status ([id number]): 0 percent impaired, limited or restricted Self Care Goal Status ([id number]): 0 percent impaired, limited or restricted Self Care Discharge Status [phone]): 0 percent impaired, limited or restricted   Evette Georges [phone] 10/08/2012, 8:44 AM

## 2012-10-08 NOTE — Progress Notes (Signed)
Discharged from floor via w/c, family with pt. No change in assessment. Rabecka Brendel  

## 2012-10-10 ENCOUNTER — Encounter (HOSPITAL_COMMUNITY): Payer: Self-pay | Admitting: Orthopedic Surgery

## 2012-10-12 NOTE — Discharge Summary (Signed)
Physician Discharge Summary   Patient ID: [name] MRN: [id number] DOB/AGE: August 17, 1961 52 y.o.  Admit date: 10/07/2012 Discharge date: 10/08/2012  Primary Diagnosis: Lumbar disc herniation  Admission Diagnoses:  Past Medical History  Diagnosis Date  . Hypertension 01/11/2012    Elevated BP on ED presentation for what is likely unrelated symptoms, patient reports a history of hypertension that he is not currently taking medications for, and ECG today and prior suggest left ventricular hypertrophy, a likely sequelae of chronic untreated hypertension if indeed it is present.  Strongly encouraged pt. To contact PCP this week (01/11/12) for re-eval and to initiate antihyperte  . Hyperlipidemia 01/11/2012    Pt. Reported history of hyperlipidemia, not currently taking medications for this condition  . Thyroid disease   . Hypothyroidism   . Arthritis    Discharge Diagnoses:   Active Problems:  Spinal stenosis of lumbar region with neurogenic claudication  Herniated lumbar intervertebral disc S/P lumbar laminectomy and microdiscectomy   Estimated Body mass index is 22.87 kg/(m^2) as calculated from the following:   Height as of this encounter: 5\' 11" (1.803 m).   Weight as of this encounter: 164 lb(74.39 kg).  Classification of overweight in adults according to BMI (WHO, 1998)   Procedure:  Procedure(s) (LRB): HEMI-MICRODISCECTOMY LUMBAR LAMINECTOMY LEVEL 1 (Left)   Consults: None  HPI: The patient is a 52 year old male who presented with back pain. The patient is here today left hip and leg pain. Symptoms including pain in the left leg and numbness down the left leg and into his toes which began 2 months ago without any known injury. The patient describes the severity of their symptoms as severe. The patient feels as if the symptoms are worsening. Symptoms are exacerbated by standing and direct pressure, while symptoms are not exacerbated by sitting. Symptoms are somewhat relieved  by rest and opioid analgesics, while symptoms are not relieved by cold packs or heat packs. Prior to being seen today the patient was previously evaluated by a primary physician and then went to the ER on his own for xrays. Past evaluation has included x-ray of the lumbar spine. Past treatment has included nonsteroidal anti-inflammatory drugs (Naprosyn), opioid analgesics and he got two injections into the buttock while in the ER. He has had pain since Thanksgiving.     Laboratory Data: Hospital Outpatient Visit on 09/30/2012  Component Date Value Range Status  . aPTT 09/30/2012 29  24 - 37 seconds Final  . Sodium 09/30/2012 141  135 - 145 mEq/L Final  . Potassium 09/30/2012 3.6  3.5 - 5.1 mEq/L Final  . Chloride 09/30/2012 103  96 - 112 mEq/L Final  . CO2 09/30/2012 27  19 - 32 mEq/L Final  . Glucose, Bld 09/30/2012 72  70 - 99 mg/dL Final  . BUN 40/98/1191 19  6 - 23 mg/dL Final  . Creatinine, Ser 09/30/2012 0.98  0.50 - 1.35 mg/dL Final  . Calcium 47/82/9562 9.8  8.4 - 10.5 mg/dL Final  . Total Protein 09/30/2012 7.5  6.0 - 8.3 g/dL Final  . Albumin 13/04/6577 3.6  3.5 - 5.2 g/dL Final  . AST 46/96/2952 21  0 - 37 U/L Final  . ALT 09/30/2012 24  0 - 53 U/L Final  . Alkaline Phosphatase 09/30/2012 54  39 - 117 U/L Final  . Total Bilirubin 09/30/2012 0.3  0.3 - 1.2 mg/dL Final  . GFR calc non Af Amer 09/30/2012 >90  >90 mL/min Final  . GFR calc  Af Amer 09/30/2012 >90  >90 mL/min Final   Comment:                                 The eGFR has been calculated                          using the CKD EPI equation.                          This calculation has not been                          validated in all clinical                          situations.                          eGFR's persistently                          <90 mL/min signify                          possible Chronic Kidney Disease.  Marland Kitchen Prothrombin Time 09/30/2012 12.7  11.6 - 15.2 seconds Final  . INR 09/30/2012 0.96  0.00  - 1.49 Final  . Color, Urine 09/30/2012 YELLOW  YELLOW Final  . APPearance 09/30/2012 CLEAR  CLEAR Final  . Specific Gravity, Urine 09/30/2012 1.026  1.005 - 1.030 Final  . pH 09/30/2012 5.5  5.0 - 8.0 Final  . Glucose, UA 09/30/2012 NEGATIVE  NEGATIVE mg/dL Final  . Hgb urine dipstick 09/30/2012 TRACE* NEGATIVE Final  . Bilirubin Urine 09/30/2012 NEGATIVE  NEGATIVE Final  . Ketones, ur 09/30/2012 NEGATIVE  NEGATIVE mg/dL Final  . Protein, ur 40/98/1191 NEGATIVE  NEGATIVE mg/dL Final  . Urobilinogen, UA 09/30/2012 0.2  0.0 - 1.0 mg/dL Final  . Nitrite 47/82/9562 NEGATIVE  NEGATIVE Final  . Leukocytes, UA 09/30/2012 NEGATIVE  NEGATIVE Final  . WBC 09/30/2012 5.9  4.0 - 10.5 K/uL Final  . RBC 09/30/2012 4.23  4.22 - 5.81 MIL/uL Final  . Hemoglobin 09/30/2012 13.0  13.0 - 17.0 g/dL Final  . HCT 13/04/6577 39.3  39.0 - 52.0 % Final  . MCV 09/30/2012 92.9  78.0 - 100.0 fL Final  . MCH 09/30/2012 30.7  26.0 - 34.0 pg Final  . MCHC 09/30/2012 33.1  30.0 - 36.0 g/dL Final  . RDW 46/96/2952 15.2  11.5 - 15.5 % Final  . Platelets 09/30/2012 638* 150 - 400 K/uL Final  . MRSA, PCR 09/30/2012 POSITIVE* NEGATIVE Final  . Staphylococcus aureus 09/30/2012 POSITIVE* NEGATIVE Final   Comment:                                 The Xpert SA Assay (FDA                          approved for NASAL specimens                          in patients over 21 years of  age),                          is one component of                          a comprehensive surveillance                          program.  Test performance has                          been validated by [facility] for patients greater                          than or equal to 55 year old.                          It is not intended                          to diagnose infection nor to                          guide or monitor treatment.  . RBC / HPF 09/30/2012 0-2  <3 RBC/hpf Final  Admission on 08/28/2012, Discharged on  08/28/2012  Component Date Value Range Status  . Sodium 08/28/2012 140  135 - 145 mEq/L Final  . Potassium 08/28/2012 4.2  3.5 - 5.1 mEq/L Final  . Chloride 08/28/2012 104  96 - 112 mEq/L Final  . BUN 08/28/2012 13  6 - 23 mg/dL Final  . Creatinine, Ser 08/28/2012 0.90  0.50 - 1.35 mg/dL Final  . Glucose, Bld 40/98/1191 107* 70 - 99 mg/dL Final  . Calcium, Ion 47/82/9562 1.29* 1.12 - 1.23 mmol/L Final  . TCO2 08/28/2012 25  0 - 100 mmol/L Final  . Hemoglobin 08/28/2012 15.3  13.0 - 17.0 g/dL Final  . HCT 13/04/6577 45.0  39.0 - 52.0 % Final     X-Rays:Dg Chest 2 View  09/30/2012  *RADIOLOGY REPORT*  Clinical Data: Preop for lumbar spine surgery, smoking history  CHEST - 2 VIEW  Comparison: The chest x-ray of 11/16/2003  Findings: There is a hazy opacity in the right mid lung field which is not seen previously.  Although this could represent scarring, a ground-glass opacity is a consideration and CT of the chest is recommended.  In addition there is a nodular opacity within the right lung apex medially.  This opacity may be bony in origin, but further assessment with CT is recommended.  The left lung is clear. Mediastinal contours are stable.  The heart is within normal limits in size.  No bony abnormality is seen.  IMPRESSION:  1.  Opacity in the right mid lung may represent a ground-glass opacity and CT of the chest is recommended. 2.  Nodular opacity in the medial right upper lung field may be bony in origin but again CT of the chest is recommended for further assessment.   Original Report Authenticated By: Dwyane Dee, M.D.    Dg Lumbar Spine 2-3 Views  09/30/2012  *RADIOLOGY REPORT*  Clinical Data: Preop for lumbar spine surgery  LUMBAR SPINE - 2-3 VIEW  Comparison: MR lumbar spine of 08/29/2012  Findings: The lumbar vertebrae are in normal alignment. Degenerative disc disease is present particularly at L3-4, L4-5, and to a lesser degree at L5-S1.  No compression deformity is seen.  IMPRESSION:  Degenerative disc disease at L3-4, L4-5, and L5-S1.   Original Report Authenticated By: Dwyane Dee, M.D.    Ct Chest Wo Contrast  10/08/2012  *RADIOLOGY REPORT*  Clinical Data: Abnormal preop chest radiograph.  Recent back surgery.  Ex-smoker.  No current complaints.  CT CHEST WITHOUT CONTRAST  Technique:  Multidetector CT imaging of the chest was performed following the standard protocol without IV contrast.  Comparison: Plain film 09/30/2012.  No prior CT.  Findings: Lungs/pleura: Plain film abnormality corresponds to right upper lobe ill-defined reticular nodular opacities, including on images 18 - 26/series 4. No right apical nodule to correspond to the other plain film abnormality questioned. Minimal anterior right lower lobe reticular nodular opacity as well. Minimal opacity in the left upper lobe, including image 21.  This could represent atelectasis. No pleural fluid.  Heart/Mediastinum: Normal heart size, without pericardial effusion. Multivessel coronary artery atherosclerosis.  Calcified plaque identified within the LAD on image 31 and the right coronary artery on image 35.  No mediastinal or definite hilar adenopathy, given limitations of unenhanced CT.  Upper abdomen: Upper pole left renal lesion which measures 2.1 cm and fluid density on image 63/series 2.  Mildly irregular in contour and incompletely imaged.  Bones/Musculoskeletal:  No acute osseous abnormality.  IMPRESSION:  1.  The plain film abnormality corresponds to mild right upper lobe clustered reticular nodular opacities.  Similar, more mild findings within the right lower lobe. This is most consistent with infection, possibly viral or atypical bacterial. 2.  No CT correlate for the questioned right apical nodular density which may have represented osseous summation shadow. 3. Age advanced coronary artery atherosclerosis.  Recommend assessment of coronary risk factors and consideration of medical therapy. 4.  Upper pole left renal lesion  which is fluid density and may represent a cyst.  However, is mildly irregular in contour, and incompletely imaged.  Therefore, non emergent renal ultrasound should be considered for further characterization.   Original Report Authenticated By: Jeronimo Greaves, M.D.    Dg Spine Portable 1 View  10/07/2012  *RADIOLOGY REPORT*  Clinical Data: Surgery.  Lumbar laminectomy at L4-L5.  PORTABLE SPINE - 1 VIEW  Comparison: Lumbar spine radiographs 10/07/2012 and 09/30/2012  Findings: Radiograph performed at 17:20 hours.  Numbering of the lumbar spine is per prior lumbar radiographs 09/30/2012.  A radiopaque linear probe projects over the L4-L5 disc space. Additional surgical devices project over the spinous processes of L4-L5.  IMPRESSION: Intraoperative localization of L4-L5.   Original Report Authenticated By: Britta Mccreedy, M.D.    Dg Spine Portable 1 View  10/07/2012  *RADIOLOGY REPORT*  Clinical Data: Lumbar laminectomy.  PORTABLE SPINE - 1 VIEW  Comparison: Earlier today at 1626 hours.  Findings: 1639 hours.  Presuming the nomenclature of the 09/30/2012 exam, surgical devices overlie the spinous processes of L4 and L5, with a probe projecting over the L4-L5 level.  IMPRESSION: Intraoperative localization of L4-L5.   Original Report Authenticated By: Jeronimo Greaves, M.D.    Dg Spine Portable 1 View  10/07/2012  *RADIOLOGY REPORT*  Clinical Data: Back pain  PORTABLE SPINE - 1 VIEW  Comparison: MRI lumbar spine 08/29/2012.  Plain films 09/30/2012.  Findings: Spinal  needles have been inserted from a posterior approach.  The upper needle lies alongside the superior margin of the L5 spinous process.  The lower needle lies inferior to the L5 spinous process.  IMPRESSION: Needle placement as described.   Original Report Authenticated By: Davonna Belling, M.D.     EKG: Orders placed during the hospital encounter of 10/07/12  . EKG     Hospital Course: [name] is a 52 y.o. who was admitted to [facility].  They were brought to the operating room on 10/07/2012 and underwent Procedure(s): HEMI-MICRODISCECTOMY LUMBAR LAMINECTOMY LEVEL 1.  Patient tolerated the procedure well and was later transferred to the recovery room and then to the orthopaedic floor for postoperative care.  They were given PO and IV analgesics for pain control following their surgery.  They were given 24 hours of postoperative antibiotics of  Anti-infectives     Start     Dose/Rate Route Frequency Ordered Stop   10/07/12 1633   polymyxin B 500,000 Units, bacitracin 50,000 Units in sodium chloride irrigation 0.9 % 500 mL irrigation  Status:  Discontinued          As needed 10/07/12 1633 10/07/12 1754   10/07/12 1315   ceFAZolin (ANCEF) IVPB 2 g/50 mL premix        2 g 100 mL/hr over 30 Minutes Intravenous On call to O.R. 10/07/12 1255 10/07/12 1607         and started on DVT prophylaxis in the form of Aspirin.   PT was ordered gait training and ambulation assistance.  Discharge planning consulted to help with postop disposition and equipment needs.  Patient had a good night on the evening of surgery and started to get up OOB with therapy on day one. Patient was seen in rounds and was ready to go home.   Discharge Medications: Prior to Admission medications   Medication Sig Start Date End Date Taking? Authorizing Provider  levothyroxine (SYNTHROID, LEVOTHROID) 150 MCG tablet Take 150 mcg by mouth daily before breakfast.    Yes Historical Provider, MD  lisinopril (PRINIVIL,ZESTRIL) 10 MG tablet Take 10 mg by mouth every morning.    Yes Historical Provider, MD  naproxen (NAPROSYN) 500 MG tablet Take 500 mg by mouth 2 (two) times daily as needed. Pain 08/23/12  Yes Vida Roller, MD  methocarbamol (ROBAXIN) 500 MG tablet Take 1 tablet (500 mg total) by mouth every 6 (six) hours as needed. 10/07/12   Clyde Zarrella Tamala Ser, PA  oxyCODONE-acetaminophen (PERCOCET/ROXICET) 5-325 MG per tablet Take 1-2 tablets by mouth every 6 (six)  hours as needed. 10/07/12   Aleathia Purdy Tamala Ser, PA    Diet: Cardiac diet Activity:WBAT Follow-up:in 2 weeks Disposition - Home Discharged Condition: good   Discharge Orders    Future Orders Please Complete By Expires   Diet - low sodium heart healthy      Diet - low sodium heart healthy      Call MD / Call 911      Comments:   If you experience chest pain or shortness of breath, CALL 911 and be transported to the hospital emergency room.  If you develope a fever above 101 F, pus (white drainage) or increased drainage or redness at the wound, or calf pain, call your surgeon's office.   Constipation Prevention      Comments:   Drink plenty of fluids.  Prune juice may be helpful.  You may use a stool softener, such as Colace (over the  counter) 100 mg twice a day.  Use MiraLax (over the counter) for constipation as needed.   Increase activity slowly as tolerated      Discharge instructions      Comments:   Change your dressing daily. Shower only, no tub bath. May shower starting Sunday.  Call if any temperatures greater than 101 or any wound complications: [phone] during the day and ask for Dr. Jeannetta Ellis nurse, [facility]. Follow up in office in 2 weeks   Driving restrictions      Comments:   No driving for 2 weeks   Lifting restrictions      Comments:   No lifting   Call MD / Call 911      Comments:   If you experience chest pain or shortness of breath, CALL 911 and be transported to the hospital emergency room.  If you develope a fever above 101 F, pus (white drainage) or increased drainage or redness at the wound, or calf pain, call your surgeon's office.   Constipation Prevention      Comments:   Drink plenty of fluids.  Prune juice may be helpful.  You may use a stool softener, such as Colace (over the counter) 100 mg twice a day.  Use MiraLax (over the counter) for constipation as needed.   Increase activity slowly as tolerated          Medication List     As of  10/12/2012  8:05 AM    STOP taking these medications         oxyCODONE-acetaminophen 10-650 MG per tablet   Commonly known as: PERCOCET      TAKE these medications         levothyroxine 150 MCG tablet   Commonly known as: SYNTHROID, LEVOTHROID   Take 150 mcg by mouth daily before breakfast.      lisinopril 10 MG tablet   Commonly known as: PRINIVIL,ZESTRIL   Take 10 mg by mouth every morning.      methocarbamol 500 MG tablet   Commonly known as: ROBAXIN   Take 1 tablet (500 mg total) by mouth every 6 (six) hours as needed.      naproxen 500 MG tablet   Commonly known as: NAPROSYN   Take 500 mg by mouth 2 (two) times daily as needed. Pain      oxyCODONE-acetaminophen 5-325 MG per tablet   Commonly known as: PERCOCET/ROXICET   Take 1-2 tablets by mouth every 6 (six) hours as needed.           Follow-up Information    Follow up with GIOFFRE,RONALD A, MD. Schedule an appointment as soon as possible for a visit in 2 weeks.   Contact information:   [address], [place] [address] [place] [address] [phone]          Signed: Kerby Nora 10/12/2012, 8:05 AM

## 2013-10-28 IMAGING — CT CT CHEST W/O CM
1 of 2 series · 14 of 32 positions shown, 18 images · non-contrast
Comparison: Plain film 09/30/2012.  No prior CT.

CLINICAL DATA: Abnormal preop chest radiograph.  Recent back
surgery.  Ex-smoker.  No current complaints.

CT CHEST WITHOUT CONTRAST
TECHNIQUE: Multidetector CT imaging of the chest was performed
following the standard protocol without IV contrast.

[Series 2: chest w/o st · axial · non-contrast · 0.66mm/px · z∈[-328,-62]mm · 14 of 63 slices shown, 18 images]
[im 5/63  mediastinal]
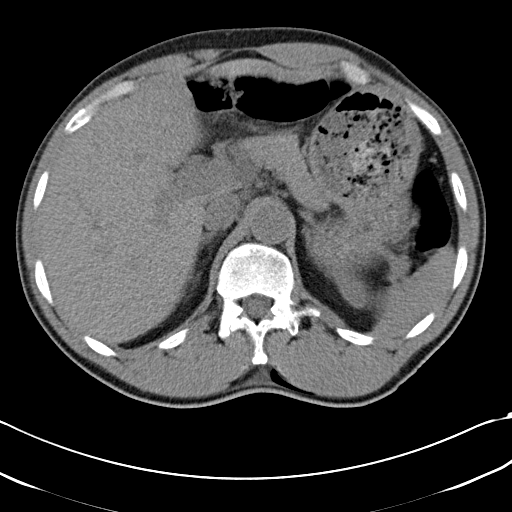
[im 5/63  lung]
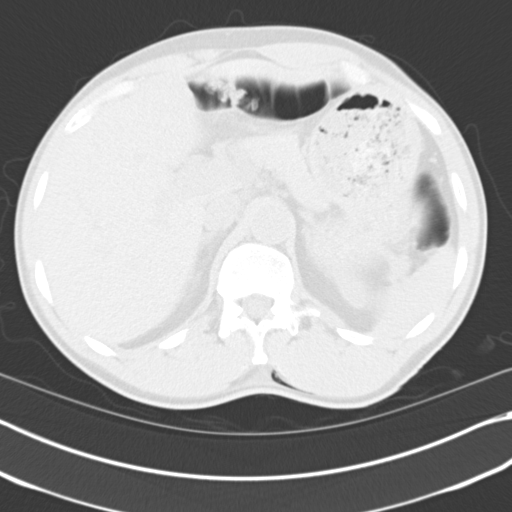
[im 10/63  lung]
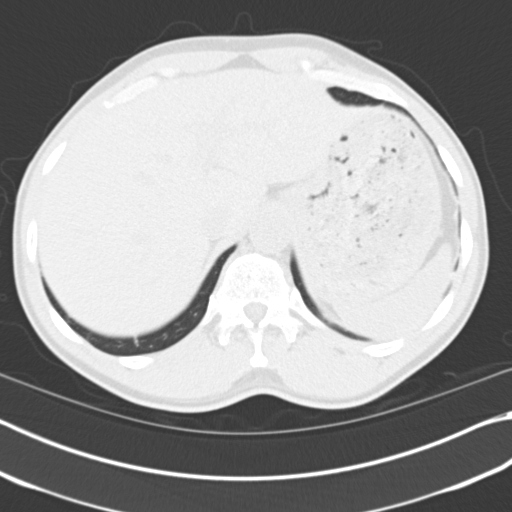
[im 15/63  lung]
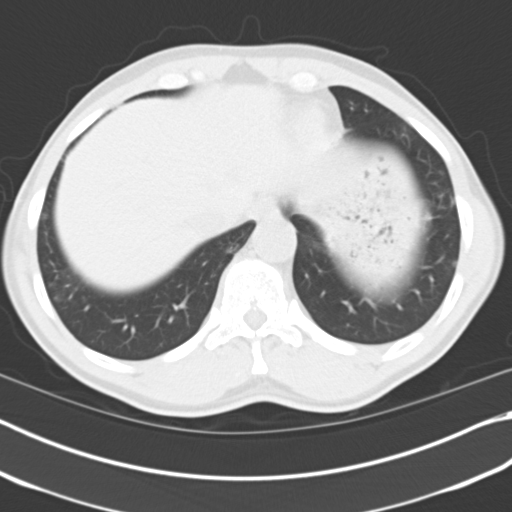
[im 20/63  lung]
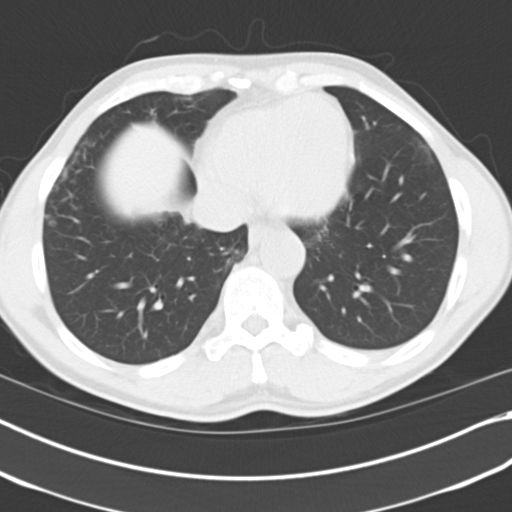
[im 24/63  mediastinal]
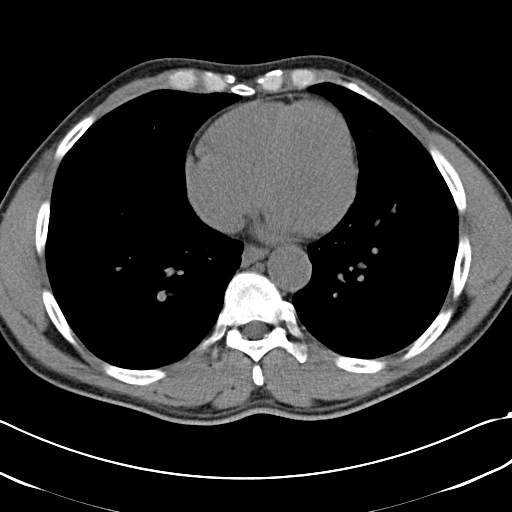
[im 24/63  lung]
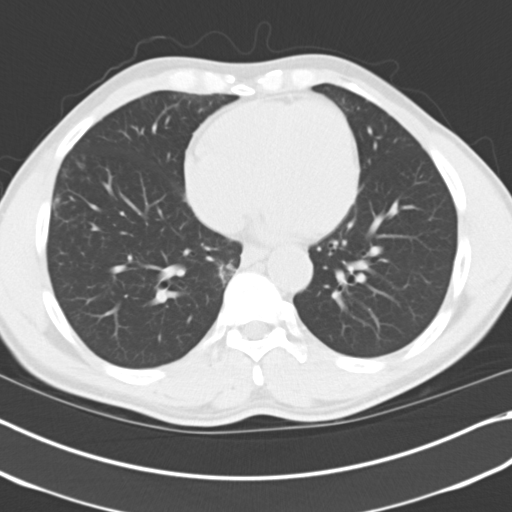
[im 29/63  lung]
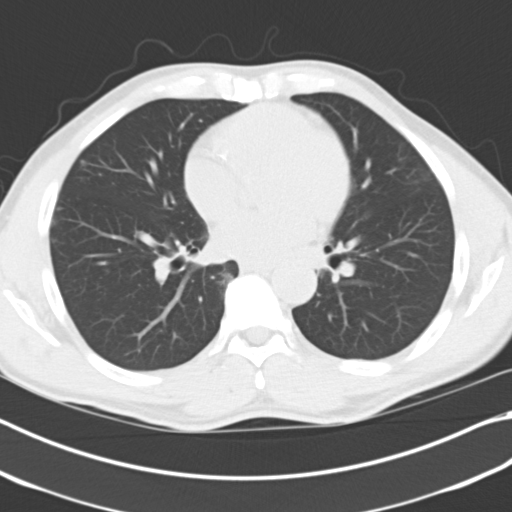
[im 30/63  lung]
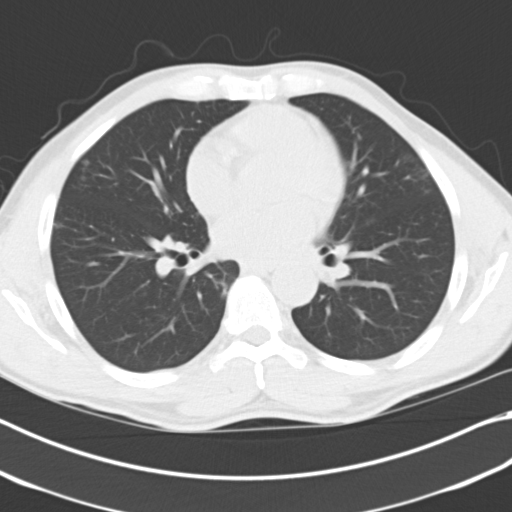
[im 32/63  lung]
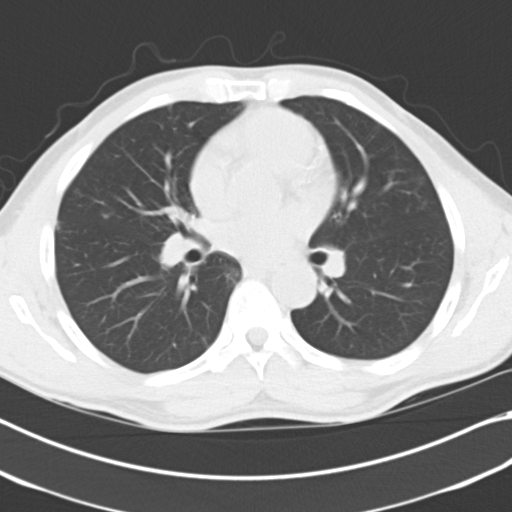
[im 34/63  mediastinal]
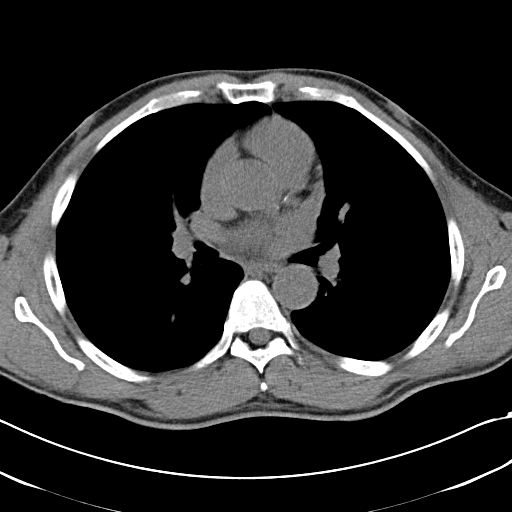
[im 34/63  lung]
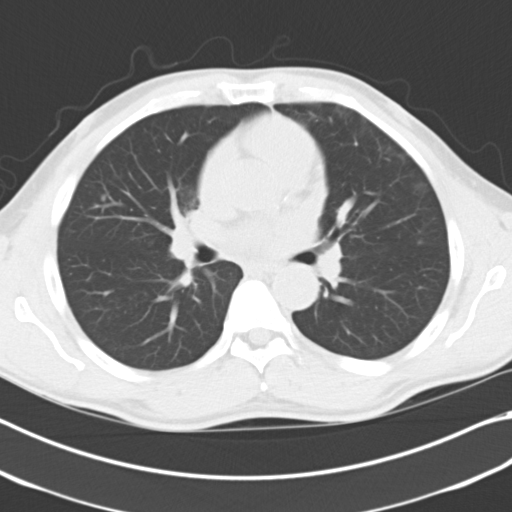
[im 39/63  lung]
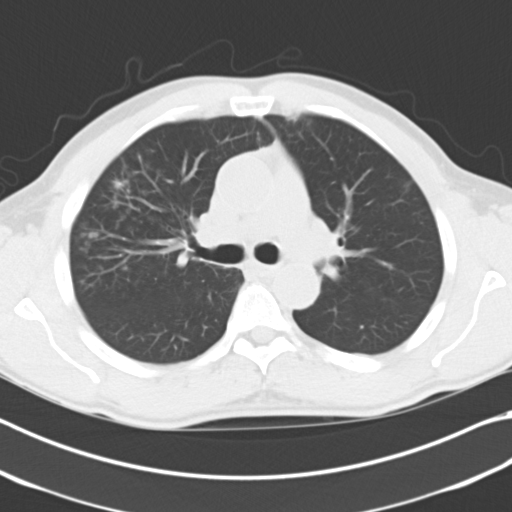
[im 43/63  lung]
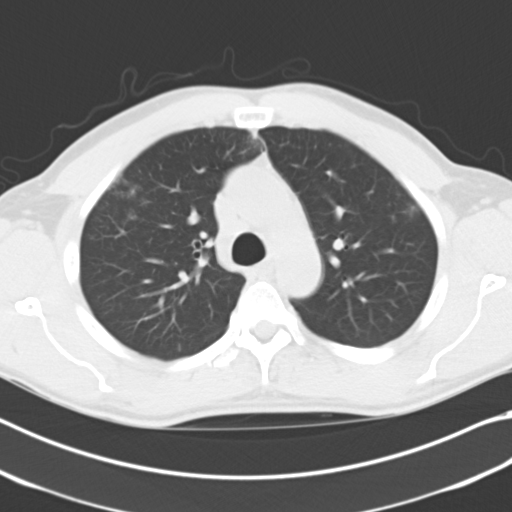
[im 48/63  lung]
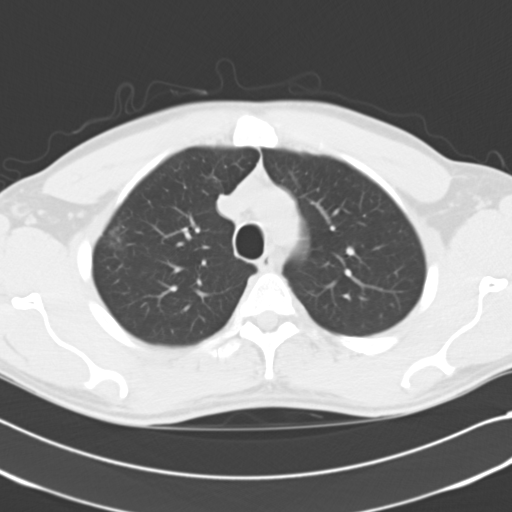
[im 53/63  mediastinal]
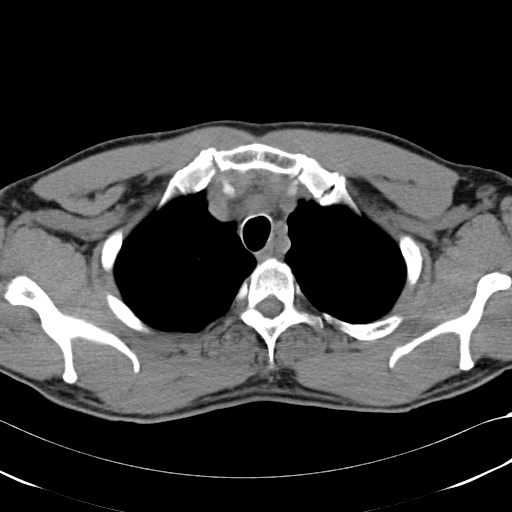
[im 53/63  lung]
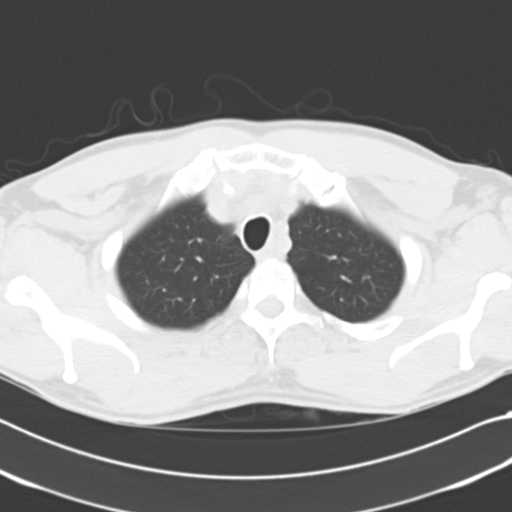
[im 58/63  lung]
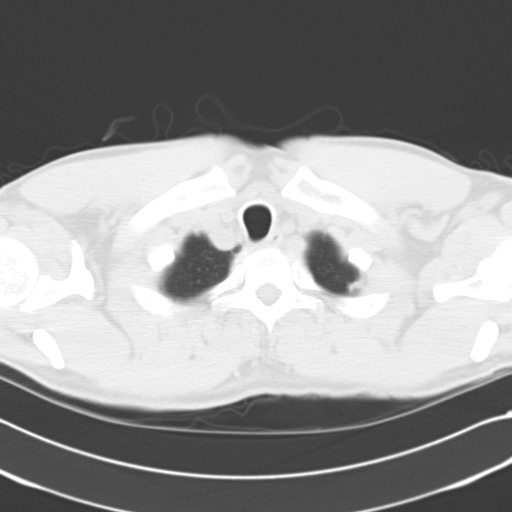

[14 of 32 positions shown; findings below may reference images not displayed]

FINDINGS: Lungs/pleura: Plain film abnormality corresponds to right
upper lobe ill-defined reticular nodular opacities, including on
images 18 - 26/series 4.
No right apical nodule to correspond to the other plain film
abnormality questioned.
Minimal anterior right lower lobe reticular nodular opacity as
well.
Minimal opacity in the left upper lobe, including image 21.  This
could represent atelectasis.
No pleural fluid.

Heart/Mediastinum: Normal heart size, without pericardial effusion.
Multivessel coronary artery atherosclerosis.  Calcified plaque
identified within the LAD on image 31 and the right coronary artery
on image 35.

No mediastinal or definite hilar adenopathy, given limitations of
unenhanced CT.

Upper abdomen: Upper pole left renal lesion which measures 2.1 cm
and fluid density on image 63/series 2.  Mildly irregular in
contour and incompletely imaged.

Bones/Musculoskeletal:  No acute osseous abnormality.
IMPRESSION: 1.  The plain film abnormality corresponds to mild right upper lobe
clustered reticular nodular opacities.  Similar, more mild findings
within the right lower lobe. This is most consistent with
infection, possibly viral or atypical bacterial.
2.  No CT correlate for the questioned right apical nodular density
which may have represented osseous summation shadow.
3. Age advanced coronary artery atherosclerosis.  Recommend
assessment of coronary risk factors and consideration of medical
therapy.
4.  Upper pole left renal lesion which is fluid density and may
represent a cyst.  However, is mildly irregular in contour, and
incompletely imaged.  Therefore, non emergent renal ultrasound
should be considered for further characterization.

## 2015-03-18 ENCOUNTER — Emergency Department (HOSPITAL_COMMUNITY)
Admission: EM | Admit: 2015-03-18 | Discharge: 2015-03-18 | Disposition: A | Discharge status: Home / Self Care | Payer: 59 | Attending: Emergency Medicine | Admitting: Emergency Medicine

## 2015-03-18 ENCOUNTER — Encounter (HOSPITAL_COMMUNITY): Payer: Self-pay | Admitting: *Deleted

## 2015-03-18 ENCOUNTER — Emergency Department (HOSPITAL_COMMUNITY): Payer: 59

## 2015-03-18 DIAGNOSIS — Y999 Unspecified external cause status: Secondary | ICD-10-CM | POA: Diagnosis not present

## 2015-03-18 DIAGNOSIS — Z720 Tobacco use: Secondary | ICD-10-CM | POA: Diagnosis not present

## 2015-03-18 DIAGNOSIS — Y929 Unspecified place or not applicable: Secondary | ICD-10-CM | POA: Diagnosis not present

## 2015-03-18 DIAGNOSIS — S66812A Strain of other specified muscles, fascia and tendons at wrist and hand level, left hand, initial encounter: Secondary | ICD-10-CM | POA: Insufficient documentation

## 2015-03-18 DIAGNOSIS — S59902A Unspecified injury of left elbow, initial encounter: Secondary | ICD-10-CM | POA: Diagnosis present

## 2015-03-18 DIAGNOSIS — X58XXXA Exposure to other specified factors, initial encounter: Secondary | ICD-10-CM | POA: Diagnosis not present

## 2015-03-18 DIAGNOSIS — Z79899 Other long term (current) drug therapy: Secondary | ICD-10-CM | POA: Diagnosis not present

## 2015-03-18 DIAGNOSIS — M1990 Unspecified osteoarthritis, unspecified site: Secondary | ICD-10-CM | POA: Insufficient documentation

## 2015-03-18 DIAGNOSIS — E039 Hypothyroidism, unspecified: Secondary | ICD-10-CM | POA: Diagnosis not present

## 2015-03-18 DIAGNOSIS — Y939 Activity, unspecified: Secondary | ICD-10-CM | POA: Insufficient documentation

## 2015-03-18 DIAGNOSIS — S46912A Strain of unspecified muscle, fascia and tendon at shoulder and upper arm level, left arm, initial encounter: Secondary | ICD-10-CM

## 2015-03-18 DIAGNOSIS — I10 Essential (primary) hypertension: Secondary | ICD-10-CM | POA: Diagnosis not present

## 2015-03-18 MED ORDER — DICLOFENAC SODIUM 75 MG PO TBEC: 75.0000 mg | DELAYED_RELEASE_TABLET | Freq: Two times a day (BID) | ORAL | Status: DC

## 2015-03-18 MED ORDER — HYDROCODONE-ACETAMINOPHEN 5-325 MG PO TABS
1.0000 | ORAL_TABLET | Freq: Once | ORAL | Status: AC
  Administered 2015-03-18: 1 via ORAL
  Filled 2015-03-18: qty 1

## 2015-03-18 MED ORDER — IBUPROFEN 800 MG PO TABS
800.0000 mg | ORAL_TABLET | Freq: Once | ORAL | Status: AC
  Administered 2015-03-18: 800 mg via ORAL
  Filled 2015-03-18: qty 1

## 2015-03-18 MED ORDER — TRAMADOL HCL 50 MG PO TABS: 50.0000 mg | ORAL_TABLET | Freq: Four times a day (QID) | ORAL | Status: DC | PRN

## 2015-03-18 NOTE — ED Provider Notes (Signed)
CSN: [id number]     Arrival date & time 03/18/15  1756 History  This chart was scribed for non-physician practitioner, Pauline Aus, PA-C working with Samuel Jester, DO found by Gwenyth Ober, ED scribe. This patient was seen in room APFT22/APFT22 and the patient's care was started at 6:22 PM   Chief Complaint  Patient presents with  . Arm Pain   The history is provided by the patient. No language interpreter was used.    HPI Comments: [name] is a 54 y.o. male who presents to the Emergency Department complaining of constant, moderate, aching left elbow pain that started this morning and was present when he woke up. Pt describes the pain as located just superior to his left elbow extending distally to his left wrist. His pain becomes worse with movement. Pt's wife tried applying a warm towel to the affected area with no relief. Pt denies recent injuries, falls,but reports heavy lifting. He also denies left shoulder pain, numbness, CP and SOB as associated symptoms.  Past Medical History  Diagnosis Date  . Hypertension 01/11/2012    Elevated BP on ED presentation for what is likely unrelated symptoms, patient reports a history of hypertension that he is not currently taking medications for, and ECG today and prior suggest left ventricular hypertrophy, a likely sequelae of chronic untreated hypertension if indeed it is present.  Strongly encouraged pt. To contact PCP this week (01/11/12) for re-eval and to initiate antihyperte  . Hyperlipidemia 01/11/2012    Pt. Reported history of hyperlipidemia, not currently taking medications for this condition  . Thyroid disease   . Hypothyroidism   . Arthritis    Past Surgical History  Procedure Laterality Date  . Thyroidectomy    . Upper neck surgery  10 yrs ago  . Right ankle surgery  20 yrs ago  . Hemi-microdiscectomy lumbar laminectomy level 1  10/07/2012    Procedure: HEMI-MICRODISCECTOMY LUMBAR LAMINECTOMY LEVEL 1;  Surgeon: Jacki Cones, MD;  Location: [facility];  Service: Orthopedics;  Laterality: Left;  . Back surgery     History reviewed. No pertinent family history. History  Substance Use Topics  . Smoking status: Current Every Day Smoker -- 0.50 packs/day for 20 years    Types: Cigarettes  . Smokeless tobacco: Never Used  . Alcohol Use: Yes     Comment: occasional use     Review of Systems  Respiratory: Negative for shortness of breath.   Cardiovascular: Negative for chest pain.  Musculoskeletal: Positive for arthralgias.  Skin: Negative for wound.  Neurological: Negative for weakness and numbness.  All other systems reviewed and are negative.  Allergies  Review of patient's allergies indicates no known allergies.  Home Medications   Prior to Admission medications   Medication Sig Start Date End Date Taking? Authorizing Provider  levothyroxine (SYNTHROID, LEVOTHROID) 150 MCG tablet Take 150 mcg by mouth daily before breakfast.     Historical Provider, MD  lisinopril (PRINIVIL,ZESTRIL) 10 MG tablet Take 10 mg by mouth every morning.     Historical Provider, MD  methocarbamol (ROBAXIN) 500 MG tablet Take 1 tablet (500 mg total) by mouth every 6 (six) hours as needed. 10/07/12   Amber Constable, PA-C  naproxen (NAPROSYN) 500 MG tablet Take 500 mg by mouth 2 (two) times daily as needed. Pain 08/23/12   Eber Hong, MD  oxyCODONE-acetaminophen (PERCOCET/ROXICET) 5-325 MG per tablet Take 1-2 tablets by mouth every 6 (six) hours as needed. 10/07/12   Dimitri Ped, PA-C  BP 161/109 mmHg  Pulse 100  Temp(Src) 98.3 F (36.8 C) (Oral)  Resp 18  Ht 5\' 8"  (1.727 m)  Wt 165 lb (74.844 kg)  BMI 25.09 kg/m2  SpO2 98% Physical Exam  Constitutional: He appears well-developed and well-nourished. No distress.  HENT:  Head: Normocephalic and atraumatic.  Eyes: Conjunctivae and EOM are normal.  Neck: Neck supple. No tracheal deviation present.  Cardiovascular: Normal rate.   Pulmonary/Chest: Effort normal. No  respiratory distress.  Musculoskeletal: He exhibits tenderness.  Tenderness over left lateral epicondyle, no effusion, no erythema, pain reproduced with full extension; distal sensation intact; cap refill <2s  Skin: Skin is warm and dry.  Psychiatric: He has a normal mood and affect. His behavior is normal.  Nursing note and vitals reviewed.   ED Course  Procedures   DIAGNOSTIC STUDIES: Oxygen Saturation is 98% on RA, normal by my interpretation.    COORDINATION OF CARE: 6:26 PM Discussed treatment plan with pt which includes an x-ray of his left elbow. Pt agreed to plan.  7:18 PM Pt is hypertensive. Discussed this with him. He has been on vacation and admits to eating a lot of salty foods lately. He is asymptomatic today and agrees to follow-up with his PCP.   Labs Review Labs Reviewed - No data to display  Imaging Review Dg Elbow Complete Left  03/18/2015   CLINICAL DATA:  Initial encounter for left elbow pain radiates into left forearm since this morning. No reported injury.  EXAM: LEFT ELBOW - COMPLETE 3+ VIEW  COMPARISON:  None.  FINDINGS: Four views study shows no evidence for fracture or dislocation. Anterior fat pad appears elevated although the posterior fat pad is not visible. Tiny fragments are seen in the region of the radial capitellar joint on 2 of views. There is no worrisome lytic or sclerotic osseous abnormality.  IMPRESSION: No gross fracture although there may be a joint effusion and tiny intra-articular loose bodies.   Electronically Signed   By: Kennith Center M.D.   On: 03/18/2015 18:50     EKG Interpretation None      MDM   Final diagnoses:  Elbow strain, left, initial encounter   Tenderness with ROM of the left elbow.  No erythema, edema or decreased ROM.  No concerning sx for septic joint.  Likely sprain.  Pt agrees to sx tx and close orthopedic f/u.  Advised to return for worsening sx  I personally performed the services described in this documentation,  which was scribed in my presence. The recorded information has been reviewed and is accurate.   Pauline Aus, PA-C 03/20/15 2150  Samuel Jester, DO 03/21/15 1456

## 2015-03-18 NOTE — ED Notes (Addendum)
Lt arm pain , onset this am, increased pain with movement, No known injury. ,  Good radial pulse.

## 2015-03-18 NOTE — Discharge Instructions (Signed)
Sprain °A sprain happens when the bands of tissue that connect bones and hold joints together (ligaments) stretch too much or tear. °HOME CARE °· Raise (elevate) the injured area to lessen puffiness (swelling). °· Put ice on the injured area 2 times a day for 2-3 days. °¨ Put ice in a plastic bag. °¨ Place a towel between your skin and the bag. °¨ Leave the ice on for 15 minutes. °· Only take medicine as told by your doctor. °· Protect your injured area until your pain and stiffness go away. °· Do not get your cast or splint wet. Cover your cast or splint with a plastic bag when you shower or take a bath. Do not swim in a pool. °· Your doctor may suggest exercises during your recovery to keep from getting stiff. °GET HELP RIGHT AWAY IF:  °· Your cast or splint becomes damaged. °· Your pain gets worse. °MAKE SURE YOU:  °· Understand these instructions. °· Will watch this condition. °· Will get help right away if you are not doing well or get worse. °Document Released: 02/24/2008 Document Revised: 06/28/2013 Document Reviewed: 09/19/2011 °[employer]® Patient Information ©2015 [employer]. This information is not intended to replace advice given to you by your health care provider. Make sure you discuss any questions you have with your health care provider. ° °

## 2015-05-09 ENCOUNTER — Other Ambulatory Visit (HOSPITAL_COMMUNITY): Payer: Self-pay | Admitting: Internal Medicine

## 2015-05-09 DIAGNOSIS — R109 Unspecified abdominal pain: Secondary | ICD-10-CM

## 2015-05-13 ENCOUNTER — Ambulatory Visit (HOSPITAL_COMMUNITY): Payer: 59

## 2016-07-10 ENCOUNTER — Encounter (HOSPITAL_COMMUNITY): Payer: Self-pay | Admitting: Emergency Medicine

## 2016-07-10 ENCOUNTER — Emergency Department (HOSPITAL_COMMUNITY): Payer: 59

## 2016-07-10 ENCOUNTER — Emergency Department (HOSPITAL_COMMUNITY)
Admission: EM | Admit: 2016-07-10 | Discharge: 2016-07-10 | Disposition: A | Discharge status: Home / Self Care | Payer: 59 | Attending: Emergency Medicine | Admitting: Emergency Medicine

## 2016-07-10 DIAGNOSIS — F17210 Nicotine dependence, cigarettes, uncomplicated: Secondary | ICD-10-CM | POA: Diagnosis not present

## 2016-07-10 DIAGNOSIS — R10A0 Flank pain, unspecified side: Secondary | ICD-10-CM

## 2016-07-10 DIAGNOSIS — M545 Low back pain, unspecified: Secondary | ICD-10-CM

## 2016-07-10 DIAGNOSIS — E039 Hypothyroidism, unspecified: Secondary | ICD-10-CM | POA: Insufficient documentation

## 2016-07-10 DIAGNOSIS — R109 Unspecified abdominal pain: Secondary | ICD-10-CM | POA: Insufficient documentation

## 2016-07-10 DIAGNOSIS — Z791 Long term (current) use of non-steroidal anti-inflammatories (NSAID): Secondary | ICD-10-CM | POA: Insufficient documentation

## 2016-07-10 DIAGNOSIS — Z79899 Other long term (current) drug therapy: Secondary | ICD-10-CM | POA: Insufficient documentation

## 2016-07-10 DIAGNOSIS — I10 Essential (primary) hypertension: Secondary | ICD-10-CM | POA: Diagnosis not present

## 2016-07-10 HISTORY — DX: Dorsalgia, unspecified: M54.9

## 2016-07-10 HISTORY — DX: Other chronic pain: G89.29

## 2016-07-10 HISTORY — DX: Radiculopathy, lumbar region: M54.16

## 2016-07-10 LAB — URINE MICROSCOPIC-ADD ON
Bacteria, UA: NONE SEEN
SQUAMOUS EPITHELIAL / LPF: NONE SEEN
WBC, UA: NONE SEEN WBC/hpf (ref 0–5)

## 2016-07-10 LAB — URINALYSIS, ROUTINE W REFLEX MICROSCOPIC
BILIRUBIN URINE: NEGATIVE
GLUCOSE, UA: 250 mg/dL — AB
KETONES UR: NEGATIVE mg/dL
Leukocytes, UA: NEGATIVE
Nitrite: NEGATIVE
PROTEIN: 30 mg/dL — AB
Specific Gravity, Urine: >1.03 — ABNORMAL HIGH (ref 1.005–1.030)
pH: 5.5 (ref 5.0–8.0)

## 2016-07-10 MED ORDER — METHOCARBAMOL 500 MG PO TABS: 1000.0000 mg | ORAL_TABLET | Freq: Four times a day (QID) | ORAL | 0 refills | Status: DC | PRN

## 2016-07-10 MED ORDER — HYDROCODONE-ACETAMINOPHEN 5-325 MG PO TABS: ORAL_TABLET | ORAL | 0 refills | Status: DC

## 2016-07-10 MED ORDER — IBUPROFEN 400 MG PO TABS
400.0000 mg | ORAL_TABLET | Freq: Once | ORAL | Status: AC
  Administered 2016-07-10: 400 mg via ORAL
  Filled 2016-07-10: qty 1

## 2016-07-10 MED ORDER — IBUPROFEN 400 MG PO TABS
ORAL_TABLET | ORAL | Status: AC
  Filled 2016-07-10: qty 1

## 2016-07-10 MED ORDER — ACETAMINOPHEN 325 MG PO TABS
650.0000 mg | ORAL_TABLET | Freq: Once | ORAL | Status: AC
  Administered 2016-07-10: 650 mg via ORAL
  Filled 2016-07-10: qty 2

## 2016-07-10 NOTE — ED Provider Notes (Signed)
[facility] DEPT Provider Note   CSN: [id number] Arrival date & time: 07/10/16  [id number]     History   Chief Complaint Chief Complaint  Patient presents with  . Flank Pain    HPI [name] is a 55 y.o. male.  HPI  Pt was seen at 0710. Per pt, c/o sudden onset and persistence of waxing and waning right sided flank "pain" that began 2 days ago.  Pt describes the pain as "like when my doctor told me I might have a kidney stone" a few months ago. Pain radiates into the right side of his abd/groin.  Has been associated with no other symptoms.  Denies testicular pain/swelling, no dysuria/hematuria, no abd pain, no N/V, no diarrhea, no black or blood in stools, no CP/SOB, no fevers, no rash.    Past Medical History:  Diagnosis Date  . Arthritis   . Chronic back pain   . Hyperlipidemia 01/11/2012   Pt. Reported history of hyperlipidemia, not currently taking medications for this condition  . Hypertension 01/11/2012   Elevated BP on ED presentation for what is likely unrelated symptoms, patient reports a history of hypertension that he is not currently taking medications for, and ECG today and prior suggest left ventricular hypertrophy, a likely sequelae of chronic untreated hypertension if indeed it is present.  Strongly encouraged pt. To contact PCP this week (01/11/12) for re-eval and to initiate antihyperte  . Hypothyroidism   . Lumbar radiculopathy   . Thyroid disease     Patient Active Problem List   Diagnosis Date Noted  . Spinal stenosis of lumbar region with neurogenic claudication 10/07/2012  . Herniated lumbar intervertebral disc 10/07/2012  . Hypertension 01/11/2012    Class: Present on Admission  . Hyperlipidemia 01/11/2012    Class: Chronic    Past Surgical History:  Procedure Laterality Date  . BACK SURGERY    . HEMI-MICRODISCECTOMY LUMBAR LAMINECTOMY LEVEL 1  10/07/2012   Procedure: HEMI-MICRODISCECTOMY LUMBAR LAMINECTOMY LEVEL 1;  Surgeon: Jacki Conesonald A Gioffre,  MD;  Location: [facility];  Service: Orthopedics;  Laterality: Left;  . right ankle surgery  20 yrs ago  . THYROIDECTOMY    . upper neck surgery  10 yrs ago       Home Medications    Prior to Admission medications   Medication Sig Start Date End Date Taking? Authorizing Provider  diclofenac (VOLTAREN) 75 MG EC tablet Take 1 tablet (75 mg total) by mouth 2 (two) times daily. Take with food 03/18/15   Tammy Triplett, PA-C  levothyroxine (SYNTHROID, LEVOTHROID) 175 MCG tablet Take 175 mcg by mouth daily. 03/07/15   Historical Provider, MD  lisinopril (PRINIVIL,ZESTRIL) 10 MG tablet Take 10 mg by mouth every morning.     Historical Provider, MD  traMADol (ULTRAM) 50 MG tablet Take 1 tablet (50 mg total) by mouth every 6 (six) hours as needed. 03/18/15   Tammy Triplett, PA-C    Family History History reviewed. No pertinent family history.  Social History Social History  Substance Use Topics  . Smoking status: Current Every Day Smoker    Packs/day: 0.50    Years: 20.00    Types: Cigarettes  . Smokeless tobacco: Never Used  . Alcohol use Yes     Comment: occasional use      Allergies   Review of patient's allergies indicates no known allergies.   Review of Systems Review of Systems ROS: Statement: All systems negative except as marked or noted in the HPI; Constitutional: Negative for  fever and chills. ; ; Eyes: Negative for eye pain, redness and discharge. ; ; ENMT: Negative for ear pain, hoarseness, nasal congestion, sinus pressure and sore throat. ; ; Cardiovascular: Negative for chest pain, palpitations, diaphoresis, dyspnea and peripheral edema. ; ; Respiratory: Negative for cough, wheezing and stridor. ; ; Gastrointestinal: Negative for nausea, vomiting, diarrhea, abdominal pain, blood in stool, hematemesis, jaundice and rectal bleeding. . ; ; Genitourinary: Negative for dysuria and hematuria. ; ; Genital:  No penile drainage or rash, no testicular pain or swelling, no scrotal rash or  swelling. ;; Musculoskeletal: +LBP. Negative for neck pain. Negative for swelling and trauma.; ; Skin: Negative for pruritus, rash, abrasions, blisters, bruising and skin lesion.; ; Neuro: Negative for headache, lightheadedness and neck stiffness. Negative for weakness, altered level of consciousness, altered mental status, extremity weakness, paresthesias, involuntary movement, seizure and syncope.       Physical Exam Updated Vital Signs BP 148/89 (BP Location: Right Arm)   Pulse 82   Temp 97.7 F (36.5 C) (Oral)   Resp 18   Ht 5\' 10"  (1.778 m)   Wt 173 lb (78.5 kg)   SpO2 94%   BMI 24.82 kg/m   Physical Exam 0715: Physical examination:  Nursing notes reviewed; Vital signs and O2 SAT reviewed;  Constitutional: Well developed, Well nourished, Well hydrated, In no acute distress; Head:  Normocephalic, atraumatic; Eyes: EOMI, PERRL, No scleral icterus; ENMT: Mouth and pharynx normal, Mucous membranes moist; Neck: Supple, Full range of motion, No lymphadenopathy; Cardiovascular: Regular rate and rhythm, No gallop; Respiratory: Breath sounds clear & equal bilaterally, No wheezes.  Speaking full sentences with ease, Normal respiratory effort/excursion; Chest: Nontender, Movement normal; Abdomen: Soft, Nontender, Nondistended, Normal bowel sounds; Genitourinary: No CVA tenderness; Spine:  No midline CS, TS, LS tenderness. +TTP right lumbar paraspinal muscles. No rash..;; Extremities: Pulses normal, No tenderness, No edema, No calf edema or asymmetry.; Neuro: AA&Ox3, Major CN grossly intact.  Speech clear. No gross focal motor or sensory deficits in extremities. Climbs on and off chair in exam room easily by himself. Gait steady.; Skin: Color normal, Warm, Dry.   ED Treatments / Results  Labs (all labs ordered are listed, but only abnormal results are displayed)   EKG  EKG Interpretation None       Radiology   Procedures Procedures (including critical care time)  Medications Ordered  in ED Medications  acetaminophen (TYLENOL) tablet 650 mg (not administered)  ibuprofen (ADVIL,MOTRIN) tablet 400 mg (not administered)     Initial Impression / Assessment and Plan / ED Course  I have reviewed the triage vital signs and the nursing notes.  Pertinent labs & imaging results that were available during my care of the patient were reviewed by me and considered in my medical decision making (see chart for details).  MDM Reviewed: previous chart, nursing note and vitals Reviewed previous: labs Interpretation: labs and CT scan    Results for orders placed or performed during the hospital encounter of 07/10/16  Urinalysis, Routine w reflex microscopic (not at [facility])  Result Value Ref Range   Color, Urine YELLOW YELLOW   APPearance CLEAR CLEAR   Specific Gravity, Urine >1.030 (H) 1.005 - 1.030   pH 5.5 5.0 - 8.0   Glucose, UA 250 (A) NEGATIVE mg/dL   Hgb urine dipstick MODERATE (A) NEGATIVE   Bilirubin Urine NEGATIVE NEGATIVE   Ketones, ur NEGATIVE NEGATIVE mg/dL   Protein, ur 30 (A) NEGATIVE mg/dL   Nitrite NEGATIVE NEGATIVE   Leukocytes,  UA NEGATIVE NEGATIVE  Urine microscopic-add on  Result Value Ref Range   Squamous Epithelial / LPF NONE SEEN NONE SEEN   WBC, UA NONE SEEN 0 - 5 WBC/hpf   RBC / HPF 6-30 0 - 5 RBC/hpf   Bacteria, UA NONE SEEN NONE SEEN   Ct Renal Stone Study Result Date: 07/10/2016 CLINICAL DATA:  55 year old male with acute right flank and abdominal pain for 2 days. EXAM: CT ABDOMEN AND PELVIS WITHOUT CONTRAST TECHNIQUE: Multidetector CT imaging of the abdomen and pelvis was performed following the standard protocol without IV contrast. COMPARISON:  04/24/2004 CT and 08/29/2012 lumbar spine MR FINDINGS: Please note that parenchymal abnormalities may be missed without intravenous contrast. Lower chest: Unremarkable Hepatobiliary: The liver and gallbladder are unremarkable. There is no evidence of biliary dilatation. Pancreas: Unremarkable Spleen:  Unremarkable Adrenals/Urinary Tract: Punctate nonobstructing right renal calculi are identified. There is no evidence of hydronephrosis or obstructing urinary calculi. Bilateral renal cysts are again noted. The adrenal glands and bladder are unremarkable. Stomach/Bowel: Unremarkable. There is no evidence of bowel obstruction or definite bowel wall thickening. The appendix is normal. Vascular/Lymphatic: Abdominal aortic atherosclerotic calcifications noted without aneurysm. No enlarged lymph nodes identified. Reproductive: Prostate is unremarkable. Other: No free fluid, focal collection or pneumoperitoneum. Musculoskeletal: Degenerative disc disease and spondylosis in the lower lumbar spine identified, severe at L3-4 and moderate at L4-5 and L5-S1. Broad-based disc bulges at these levels and foraminal narrowing at these levels noted. No suspicious focal bony lesions are identified. IMPRESSION: No evidence of acute abnormality. Punctate nonobstructing right renal calculi. Moderate to severe degenerative changes from L3-S1 line with broad-based disc bulges and foraminal narrowing at these levels. Abdominal aortic atherosclerosis. Electronically Signed   By: Harmon Pier M.D.   On: 07/10/2016 09:04     0915:  CT as above. Tx symptomatically, f/u PMD. Dx and testing d/w pt.  Questions answered.  Verb understanding, agreeable to d/c home with outpt f/u.   Final Clinical Impressions(s) / ED Diagnoses   Final diagnoses:  Flank pain    New Prescriptions New Prescriptions   No medications on file     Samuel Jester, DO 07/12/16 1717

## 2016-07-10 NOTE — ED Triage Notes (Signed)
PT c/o right sided flank pain and right sided groin pain with no n/v/d or urinary symptoms x2 days.

## 2016-07-10 NOTE — Discharge Instructions (Signed)
Take the prescriptions as directed.  Apply moist heat or ice to the area(s) of discomfort, for 15 minutes at a time, several times per day for the next few days.  Do not fall asleep on a heating or ice pack.  Call your regular medical doctor today to schedule a follow up appointment in the next 2 days.  Return to the Emergency Department immediately if worsening.

## 2016-07-10 NOTE — ED Notes (Signed)
Pt reports the pain only comes on with movement. The pain starts in the right groin and shoots to the right flank area. Denies n/v/d. Pt able to void normally.

## 2016-07-12 LAB — URINE CULTURE: Culture: NO GROWTH

## 2017-09-03 DIAGNOSIS — Z1389 Encounter for screening for other disorder: Secondary | ICD-10-CM | POA: Diagnosis not present

## 2017-09-03 DIAGNOSIS — Z6823 Body mass index (BMI) 23.0-23.9, adult: Secondary | ICD-10-CM | POA: Diagnosis not present

## 2017-09-03 DIAGNOSIS — H6122 Impacted cerumen, left ear: Secondary | ICD-10-CM | POA: Diagnosis not present

## 2017-09-03 DIAGNOSIS — I10 Essential (primary) hypertension: Secondary | ICD-10-CM | POA: Diagnosis not present

## 2017-09-03 DIAGNOSIS — H938X2 Other specified disorders of left ear: Secondary | ICD-10-CM | POA: Diagnosis not present

## 2017-09-03 DIAGNOSIS — Z0001 Encounter for general adult medical examination with abnormal findings: Secondary | ICD-10-CM | POA: Diagnosis not present

## 2017-09-03 DIAGNOSIS — E063 Autoimmune thyroiditis: Secondary | ICD-10-CM | POA: Diagnosis not present

## 2017-09-06 DIAGNOSIS — Z6823 Body mass index (BMI) 23.0-23.9, adult: Secondary | ICD-10-CM | POA: Diagnosis not present

## 2017-09-06 DIAGNOSIS — Z1389 Encounter for screening for other disorder: Secondary | ICD-10-CM | POA: Diagnosis not present

## 2017-09-06 DIAGNOSIS — S39011A Strain of muscle, fascia and tendon of abdomen, initial encounter: Secondary | ICD-10-CM | POA: Diagnosis not present

## 2017-09-06 DIAGNOSIS — R1031 Right lower quadrant pain: Secondary | ICD-10-CM | POA: Diagnosis not present

## 2017-09-28 DIAGNOSIS — L821 Other seborrheic keratosis: Secondary | ICD-10-CM | POA: Diagnosis not present

## 2017-11-15 DIAGNOSIS — M542 Cervicalgia: Secondary | ICD-10-CM | POA: Diagnosis not present

## 2017-11-15 DIAGNOSIS — Z1389 Encounter for screening for other disorder: Secondary | ICD-10-CM | POA: Diagnosis not present

## 2017-11-15 DIAGNOSIS — M5412 Radiculopathy, cervical region: Secondary | ICD-10-CM | POA: Diagnosis not present

## 2017-11-15 DIAGNOSIS — Z6824 Body mass index (BMI) 24.0-24.9, adult: Secondary | ICD-10-CM | POA: Diagnosis not present

## 2018-02-03 DIAGNOSIS — I10 Essential (primary) hypertension: Secondary | ICD-10-CM | POA: Diagnosis not present

## 2018-02-03 DIAGNOSIS — E039 Hypothyroidism, unspecified: Secondary | ICD-10-CM | POA: Diagnosis not present

## 2018-02-03 DIAGNOSIS — Z1389 Encounter for screening for other disorder: Secondary | ICD-10-CM | POA: Diagnosis not present

## 2018-02-03 DIAGNOSIS — Z6824 Body mass index (BMI) 24.0-24.9, adult: Secondary | ICD-10-CM | POA: Diagnosis not present

## 2018-02-03 DIAGNOSIS — E782 Mixed hyperlipidemia: Secondary | ICD-10-CM | POA: Diagnosis not present

## 2018-08-08 DIAGNOSIS — E039 Hypothyroidism, unspecified: Secondary | ICD-10-CM | POA: Diagnosis not present

## 2018-08-08 DIAGNOSIS — E063 Autoimmune thyroiditis: Secondary | ICD-10-CM | POA: Diagnosis not present

## 2018-10-28 DIAGNOSIS — E039 Hypothyroidism, unspecified: Secondary | ICD-10-CM | POA: Diagnosis not present

## 2019-02-22 DIAGNOSIS — I10 Essential (primary) hypertension: Secondary | ICD-10-CM | POA: Diagnosis not present

## 2019-02-22 DIAGNOSIS — Z1389 Encounter for screening for other disorder: Secondary | ICD-10-CM | POA: Diagnosis not present

## 2019-02-22 DIAGNOSIS — Z6823 Body mass index (BMI) 23.0-23.9, adult: Secondary | ICD-10-CM | POA: Diagnosis not present

## 2019-02-22 DIAGNOSIS — E063 Autoimmune thyroiditis: Secondary | ICD-10-CM | POA: Diagnosis not present

## 2019-06-01 DIAGNOSIS — M542 Cervicalgia: Secondary | ICD-10-CM | POA: Diagnosis not present

## 2019-06-01 DIAGNOSIS — E063 Autoimmune thyroiditis: Secondary | ICD-10-CM | POA: Diagnosis not present

## 2019-06-01 DIAGNOSIS — Z6822 Body mass index (BMI) 22.0-22.9, adult: Secondary | ICD-10-CM | POA: Diagnosis not present

## 2019-06-01 DIAGNOSIS — I10 Essential (primary) hypertension: Secondary | ICD-10-CM | POA: Diagnosis not present

## 2019-06-01 DIAGNOSIS — R7309 Other abnormal glucose: Secondary | ICD-10-CM | POA: Diagnosis not present

## 2019-08-10 DIAGNOSIS — E039 Hypothyroidism, unspecified: Secondary | ICD-10-CM | POA: Diagnosis not present

## 2019-09-26 DIAGNOSIS — E039 Hypothyroidism, unspecified: Secondary | ICD-10-CM | POA: Diagnosis not present

## 2019-10-23 DIAGNOSIS — M5030 Other cervical disc degeneration, unspecified cervical region: Secondary | ICD-10-CM | POA: Diagnosis not present

## 2019-10-23 DIAGNOSIS — M5412 Radiculopathy, cervical region: Secondary | ICD-10-CM | POA: Diagnosis not present

## 2019-10-23 DIAGNOSIS — Z6823 Body mass index (BMI) 23.0-23.9, adult: Secondary | ICD-10-CM | POA: Diagnosis not present

## 2019-10-31 ENCOUNTER — Other Ambulatory Visit: Payer: Self-pay | Admitting: Family Medicine

## 2019-10-31 DIAGNOSIS — M5030 Other cervical disc degeneration, unspecified cervical region: Secondary | ICD-10-CM

## 2019-10-31 DIAGNOSIS — M5412 Radiculopathy, cervical region: Secondary | ICD-10-CM

## 2019-11-08 ENCOUNTER — Other Ambulatory Visit (HOSPITAL_COMMUNITY): Payer: Self-pay | Admitting: Family Medicine

## 2019-11-08 DIAGNOSIS — M5412 Radiculopathy, cervical region: Secondary | ICD-10-CM

## 2019-11-08 DIAGNOSIS — M5030 Other cervical disc degeneration, unspecified cervical region: Secondary | ICD-10-CM

## 2019-11-29 ENCOUNTER — Other Ambulatory Visit: Payer: Self-pay

## 2019-11-29 ENCOUNTER — Ambulatory Visit (HOSPITAL_COMMUNITY)
Admission: RE | Admit: 2019-11-29 | Discharge: 2019-11-29 | Disposition: A | Discharge status: Home / Self Care | Payer: BC Managed Care – PPO | Source: Ambulatory Visit | Attending: Family Medicine | Admitting: Family Medicine

## 2019-11-29 DIAGNOSIS — M5030 Other cervical disc degeneration, unspecified cervical region: Secondary | ICD-10-CM | POA: Diagnosis not present

## 2019-11-29 DIAGNOSIS — M542 Cervicalgia: Secondary | ICD-10-CM | POA: Diagnosis not present

## 2019-11-29 DIAGNOSIS — M5412 Radiculopathy, cervical region: Secondary | ICD-10-CM | POA: Insufficient documentation

## 2019-12-07 DIAGNOSIS — M542 Cervicalgia: Secondary | ICD-10-CM | POA: Diagnosis not present

## 2020-01-04 DIAGNOSIS — M542 Cervicalgia: Secondary | ICD-10-CM | POA: Diagnosis not present

## 2020-01-04 DIAGNOSIS — Z1152 Encounter for screening for COVID-19: Secondary | ICD-10-CM | POA: Diagnosis not present

## 2020-01-10 DIAGNOSIS — M542 Cervicalgia: Secondary | ICD-10-CM | POA: Diagnosis not present

## 2020-01-10 DIAGNOSIS — M4802 Spinal stenosis, cervical region: Secondary | ICD-10-CM | POA: Diagnosis not present

## 2020-01-10 DIAGNOSIS — M4722 Other spondylosis with radiculopathy, cervical region: Secondary | ICD-10-CM | POA: Diagnosis not present

## 2020-01-10 DIAGNOSIS — M47812 Spondylosis without myelopathy or radiculopathy, cervical region: Secondary | ICD-10-CM | POA: Diagnosis not present

## 2020-01-10 DIAGNOSIS — Z981 Arthrodesis status: Secondary | ICD-10-CM | POA: Diagnosis not present

## 2020-02-15 DIAGNOSIS — M542 Cervicalgia: Secondary | ICD-10-CM | POA: Diagnosis not present

## 2020-02-21 DIAGNOSIS — E039 Hypothyroidism, unspecified: Secondary | ICD-10-CM | POA: Diagnosis not present

## 2020-02-21 DIAGNOSIS — E063 Autoimmune thyroiditis: Secondary | ICD-10-CM | POA: Diagnosis not present

## 2020-04-16 DIAGNOSIS — M542 Cervicalgia: Secondary | ICD-10-CM | POA: Diagnosis not present

## 2020-05-14 DIAGNOSIS — M542 Cervicalgia: Secondary | ICD-10-CM | POA: Diagnosis not present

## 2020-05-15 DIAGNOSIS — Z6823 Body mass index (BMI) 23.0-23.9, adult: Secondary | ICD-10-CM | POA: Diagnosis not present

## 2020-05-15 DIAGNOSIS — Z1389 Encounter for screening for other disorder: Secondary | ICD-10-CM | POA: Diagnosis not present

## 2020-05-15 DIAGNOSIS — I10 Essential (primary) hypertension: Secondary | ICD-10-CM | POA: Diagnosis not present

## 2021-05-16 DIAGNOSIS — H6123 Impacted cerumen, bilateral: Secondary | ICD-10-CM | POA: Diagnosis not present

## 2021-05-16 DIAGNOSIS — Z6823 Body mass index (BMI) 23.0-23.9, adult: Secondary | ICD-10-CM | POA: Diagnosis not present

## 2021-05-16 DIAGNOSIS — Z1331 Encounter for screening for depression: Secondary | ICD-10-CM | POA: Diagnosis not present

## 2021-05-16 DIAGNOSIS — I10 Essential (primary) hypertension: Secondary | ICD-10-CM | POA: Diagnosis not present

## 2021-05-16 DIAGNOSIS — Z0001 Encounter for general adult medical examination with abnormal findings: Secondary | ICD-10-CM | POA: Diagnosis not present

## 2021-05-16 DIAGNOSIS — Z23 Encounter for immunization: Secondary | ICD-10-CM | POA: Diagnosis not present

## 2021-05-21 DIAGNOSIS — Z23 Encounter for immunization: Secondary | ICD-10-CM | POA: Diagnosis not present

## 2021-05-21 DIAGNOSIS — Z6823 Body mass index (BMI) 23.0-23.9, adult: Secondary | ICD-10-CM | POA: Diagnosis not present

## 2021-05-21 DIAGNOSIS — Z1389 Encounter for screening for other disorder: Secondary | ICD-10-CM | POA: Diagnosis not present

## 2021-07-08 DIAGNOSIS — E039 Hypothyroidism, unspecified: Secondary | ICD-10-CM | POA: Diagnosis not present

## 2022-07-20 DIAGNOSIS — Z1331 Encounter for screening for depression: Secondary | ICD-10-CM | POA: Diagnosis not present

## 2022-07-20 DIAGNOSIS — R7309 Other abnormal glucose: Secondary | ICD-10-CM | POA: Diagnosis not present

## 2022-07-20 DIAGNOSIS — Z0000 Encounter for general adult medical examination without abnormal findings: Secondary | ICD-10-CM | POA: Diagnosis not present

## 2022-07-20 DIAGNOSIS — E785 Hyperlipidemia, unspecified: Secondary | ICD-10-CM | POA: Diagnosis not present

## 2022-07-20 DIAGNOSIS — I10 Essential (primary) hypertension: Secondary | ICD-10-CM | POA: Diagnosis not present

## 2022-07-20 DIAGNOSIS — Z6824 Body mass index (BMI) 24.0-24.9, adult: Secondary | ICD-10-CM | POA: Diagnosis not present

## 2022-07-20 DIAGNOSIS — E119 Type 2 diabetes mellitus without complications: Secondary | ICD-10-CM | POA: Diagnosis not present

## 2022-08-20 ENCOUNTER — Ambulatory Visit (HOSPITAL_COMMUNITY)
Admission: RE | Admit: 2022-08-20 | Discharge: 2022-08-20 | Disposition: A | Discharge status: Home / Self Care | Payer: BC Managed Care – PPO | Source: Ambulatory Visit | Attending: Family Medicine | Admitting: Family Medicine

## 2022-08-20 ENCOUNTER — Other Ambulatory Visit (HOSPITAL_COMMUNITY): Payer: Self-pay | Admitting: Family Medicine

## 2022-08-20 DIAGNOSIS — N5082 Scrotal pain: Secondary | ICD-10-CM

## 2022-08-20 DIAGNOSIS — N433 Hydrocele, unspecified: Secondary | ICD-10-CM | POA: Diagnosis not present

## 2022-08-20 DIAGNOSIS — Z6824 Body mass index (BMI) 24.0-24.9, adult: Secondary | ICD-10-CM | POA: Diagnosis not present

## 2022-08-20 DIAGNOSIS — R1031 Right lower quadrant pain: Secondary | ICD-10-CM | POA: Diagnosis not present

## 2022-08-20 DIAGNOSIS — I861 Scrotal varices: Secondary | ICD-10-CM | POA: Diagnosis not present

## 2022-09-01 ENCOUNTER — Encounter: Payer: Self-pay | Admitting: General Surgery

## 2022-09-01 ENCOUNTER — Ambulatory Visit: Payer: BC Managed Care – PPO | Admitting: General Surgery

## 2022-09-01 VITALS — BP 151/96 | HR 81 | Temp 98.5°F | Resp 14 | Ht 70.0 in | Wt 185.0 lb

## 2022-09-01 DIAGNOSIS — R1031 Right lower quadrant pain: Secondary | ICD-10-CM | POA: Diagnosis not present

## 2022-09-01 MED ORDER — GABAPENTIN 300 MG PO CAPS: 300.0000 mg | ORAL_CAPSULE | Freq: Three times a day (TID) | ORAL | 1 refills | Status: AC

## 2022-09-01 NOTE — Progress Notes (Signed)
[name]; [id number]; 05-27-1961   HPI Patient is a 61 year old black male who was referred to my care by Dr. Gerarda Fraction for evaluation and treatment of right groin pain.  Patient states he has had right groin pain for many months now.  It seems to come on spontaneously.  He states that he developed swelling into his right testicle.  The pain is usually concentrated to the right testicle.  It is difficult to a certain whether this occurs while straining or lifting something heavy.  The pain is sharp in nature and usually resolves on its own.  No nausea or vomiting have been noted. Past Medical History:  Diagnosis Date   Arthritis    Chronic back pain    Hyperlipidemia 01/11/2012   Pt. Reported history of hyperlipidemia, not currently taking medications for this condition   Hypertension 01/11/2012   Elevated BP on ED presentation for what is likely unrelated symptoms, patient reports a history of hypertension that he is not currently taking medications for, and ECG today and prior suggest left ventricular hypertrophy, a likely sequelae of chronic untreated hypertension if indeed it is present.  Strongly encouraged pt. To contact PCP this week (01/11/12) for re-eval and to initiate antihyperte   Hypothyroidism    Lumbar radiculopathy    Thyroid disease     Past Surgical History:  Procedure Laterality Date   BACK SURGERY     HEMI-MICRODISCECTOMY LUMBAR LAMINECTOMY LEVEL 1  10/07/2012   Procedure: HEMI-MICRODISCECTOMY LUMBAR LAMINECTOMY LEVEL 1;  Surgeon: Tobi Bastos, MD;  Location: [facility];  Service: Orthopedics;  Laterality: Left;   right ankle surgery  20 yrs ago   THYROIDECTOMY     upper neck surgery  10 yrs ago    History reviewed. No pertinent family history.  Current Outpatient Medications on File Prior to Visit  Medication Sig Dispense Refill   amLODipine (NORVASC) 2.5 MG tablet Take 2.5 mg by mouth daily.     levothyroxine (SYNTHROID, LEVOTHROID) 175 MCG tablet Take 175 mcg by  mouth daily.  5   losartan (COZAAR) 100 MG tablet Take 100 mg by mouth daily.     olmesartan (BENICAR) 40 MG tablet Take 40 mg by mouth daily.     rosuvastatin (CRESTOR) 5 MG tablet Take 5 mg by mouth daily.     No current facility-administered medications on file prior to visit.    No Known Allergies  Social History   Substance and Sexual Activity  Alcohol Use Yes   Comment: occasional use     Social History   Tobacco Use  Smoking Status Every Day   Packs/day: 0.50   Years: 20.00   Total pack years: 10.00   Types: Cigarettes  Smokeless Tobacco Never    Review of Systems  Constitutional: Negative.   HENT: Negative.    Eyes: Negative.   Respiratory: Negative.    Cardiovascular: Negative.   Gastrointestinal: Negative.   Genitourinary: Negative.   Musculoskeletal:  Positive for joint pain and neck pain.  Skin: Negative.   Neurological: Negative.   Endo/Heme/Allergies: Negative.   Psychiatric/Behavioral: Negative.      Objective   Vitals:   09/01/22 1524  BP: (!) 151/96  Pulse: 81  Resp: 14  Temp: 98.5 F (36.9 C)  SpO2: 97%    Physical Exam Vitals reviewed.  Constitutional:      Appearance: Normal appearance. He is normal weight. He is not ill-appearing.  HENT:     Head: Normocephalic and atraumatic.  Cardiovascular:  Rate and Rhythm: Normal rate and regular rhythm.     Heart sounds: Normal heart sounds. No murmur heard.    No friction rub. No gallop.  Pulmonary:     Effort: Pulmonary effort is normal. No respiratory distress.     Breath sounds: Normal breath sounds. No stridor. No wheezing, rhonchi or rales.  Abdominal:     General: Abdomen is flat. Bowel sounds are normal. There is no distension.     Palpations: Abdomen is soft. There is no mass.     Tenderness: There is no abdominal tenderness. There is no guarding or rebound.     Hernia: No hernia is present.     Comments: There is is laxity of the inguinal wall, right greater than left,  but I cannot feel a discrete hernia.  He was examined while lying down and standing.  Genitourinary:    Comments: I do not appreciate any significant hydrocele or epididymal pain. Skin:    General: Skin is warm and dry.  Neurological:     Mental Status: He is alert and oriented to person, place, and time.    Ultrasound report reviewed Assessment  Right testicular pain of unknown etiology.  The ultrasound report stated there was trace hydroceles and varicosities of the spermatic cord, but I could not appreciate significantly this on physical examination.  No right inguinal hernia was fully identified. Plan  I told the patient I would like to try him on gabapentin for 1 month and then reexamine him.  I may need to get urology involved to assess the testicles.  I will see him again on 10/06/2022.  He understands and agrees to the treatment plan.

## 2022-09-18 ENCOUNTER — Telehealth: Payer: Self-pay | Admitting: *Deleted

## 2022-09-18 NOTE — Telephone Encounter (Signed)
Patient came to office to discuss R groin/ leg pain.   Reports that pain has worsened. States that pain is more localized to groin and not testicle/ scrotum. States that pain is radiating down inner thigh and leg.   Patient states that he is taking Gabapentin 300mg  TID as prescribed with minimal relief.   Next appointment is 10/06/2022.   Advised that if testicle is painful, he will need to see urology.   Patient noted to be pointing to groin and not testicle.   Please advise.

## 2022-09-20 ENCOUNTER — Emergency Department (HOSPITAL_COMMUNITY)
Admission: EM | Admit: 2022-09-20 | Discharge: 2022-09-20 | Discharge status: Against Medical Advice | Payer: BC Managed Care – PPO | Attending: Emergency Medicine | Admitting: Emergency Medicine

## 2022-09-20 ENCOUNTER — Encounter (HOSPITAL_COMMUNITY): Payer: Self-pay

## 2022-09-20 DIAGNOSIS — M79605 Pain in left leg: Secondary | ICD-10-CM | POA: Diagnosis not present

## 2022-09-20 DIAGNOSIS — M79651 Pain in right thigh: Secondary | ICD-10-CM

## 2022-09-20 DIAGNOSIS — Z5321 Procedure and treatment not carried out due to patient leaving prior to being seen by health care provider: Secondary | ICD-10-CM | POA: Insufficient documentation

## 2022-09-20 DIAGNOSIS — M79604 Pain in right leg: Secondary | ICD-10-CM | POA: Insufficient documentation

## 2022-09-20 NOTE — ED Triage Notes (Signed)
Pt reports pain to bilateral thighs.  Denies any injury, reports similar episode previously with muscle spasms.  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

## 2022-09-20 NOTE — ED Notes (Signed)
Requested ETA of provider to room.

## 2022-09-20 NOTE — ED Notes (Signed)
Pt awaiting provider. Pt given blankets to provide comfort.

## 2022-10-06 ENCOUNTER — Encounter: Payer: Self-pay | Admitting: General Surgery

## 2022-10-06 ENCOUNTER — Ambulatory Visit: Payer: BC Managed Care – PPO | Admitting: General Surgery

## 2022-10-06 ENCOUNTER — Ambulatory Visit (INDEPENDENT_AMBULATORY_CARE_PROVIDER_SITE_OTHER): Payer: BC Managed Care – PPO | Admitting: General Surgery

## 2022-10-06 VITALS — BP 146/93 | HR 78 | Temp 98.3°F | Resp 14 | Ht 69.0 in | Wt 187.0 lb

## 2022-10-06 DIAGNOSIS — R1031 Right lower quadrant pain: Secondary | ICD-10-CM | POA: Diagnosis not present

## 2022-10-06 NOTE — Progress Notes (Signed)
Subjective:     [name]  Patient here for follow-up of right groin pain.  He states the gabapentin did treat his right groin pain.  He does complain of some thigh discomfort and tightness at the end of working.  He took a muscle relaxant that his friend gave him and this did resolve that.  He still has primarily right testicular pain.  He states that is better. Objective:    BP (!) 146/93   Pulse 78   Temp 98.3 F (36.8 C) (Oral)   Resp 14   Ht 5\' 9"  (1.753 m)   Wt 187 lb (84.8 kg)   SpO2 98%   BMI 27.62 kg/m   General:  alert, cooperative, and no distress  Abdomen is soft.  He does have laxity of the right groin floor, but no discrete hernia is discernible.     Assessment:    Right groin pain, resolved.  Probable musculoskeletal nature.  No hernia present.    Plan:   I told the patient that he should return to Dr. Nolon Rod care should he continue to have right testicular pain.  He may need urologic consultation should it not resolve.  He may need also to see him for ongoing muscular pain.  Follow-up here as needed.

## 2022-11-24 DIAGNOSIS — Z1211 Encounter for screening for malignant neoplasm of colon: Secondary | ICD-10-CM | POA: Diagnosis not present

## 2022-11-24 DIAGNOSIS — D124 Benign neoplasm of descending colon: Secondary | ICD-10-CM | POA: Diagnosis not present

## 2023-05-12 DIAGNOSIS — Z6824 Body mass index (BMI) 24.0-24.9, adult: Secondary | ICD-10-CM | POA: Diagnosis not present

## 2023-05-12 DIAGNOSIS — E785 Hyperlipidemia, unspecified: Secondary | ICD-10-CM | POA: Diagnosis not present

## 2023-05-12 DIAGNOSIS — M62838 Other muscle spasm: Secondary | ICD-10-CM | POA: Diagnosis not present

## 2023-05-12 DIAGNOSIS — I10 Essential (primary) hypertension: Secondary | ICD-10-CM | POA: Diagnosis not present

## 2023-08-02 DIAGNOSIS — E559 Vitamin D deficiency, unspecified: Secondary | ICD-10-CM | POA: Diagnosis not present

## 2023-08-02 DIAGNOSIS — Z1331 Encounter for screening for depression: Secondary | ICD-10-CM | POA: Diagnosis not present

## 2023-08-02 DIAGNOSIS — I10 Essential (primary) hypertension: Secondary | ICD-10-CM | POA: Diagnosis not present

## 2023-08-02 DIAGNOSIS — Z0001 Encounter for general adult medical examination with abnormal findings: Secondary | ICD-10-CM | POA: Diagnosis not present

## 2023-08-02 DIAGNOSIS — E538 Deficiency of other specified B group vitamins: Secondary | ICD-10-CM | POA: Diagnosis not present

## 2023-08-02 DIAGNOSIS — M4306 Spondylolysis, lumbar region: Secondary | ICD-10-CM | POA: Diagnosis not present

## 2023-08-02 DIAGNOSIS — Z6824 Body mass index (BMI) 24.0-24.9, adult: Secondary | ICD-10-CM | POA: Diagnosis not present

## 2023-08-02 DIAGNOSIS — M5416 Radiculopathy, lumbar region: Secondary | ICD-10-CM | POA: Diagnosis not present

## 2023-08-02 DIAGNOSIS — R7309 Other abnormal glucose: Secondary | ICD-10-CM | POA: Diagnosis not present

## 2023-08-02 DIAGNOSIS — E785 Hyperlipidemia, unspecified: Secondary | ICD-10-CM | POA: Diagnosis not present

## 2023-10-18 DIAGNOSIS — E039 Hypothyroidism, unspecified: Secondary | ICD-10-CM | POA: Diagnosis not present

## 2023-12-08 DIAGNOSIS — Z09 Encounter for follow-up examination after completed treatment for conditions other than malignant neoplasm: Secondary | ICD-10-CM | POA: Diagnosis not present

## 2023-12-08 DIAGNOSIS — K5730 Diverticulosis of large intestine without perforation or abscess without bleeding: Secondary | ICD-10-CM | POA: Diagnosis not present

## 2023-12-08 DIAGNOSIS — D122 Benign neoplasm of ascending colon: Secondary | ICD-10-CM | POA: Diagnosis not present

## 2023-12-08 DIAGNOSIS — Z860101 Personal history of adenomatous and serrated colon polyps: Secondary | ICD-10-CM | POA: Diagnosis not present

## 2023-12-08 DIAGNOSIS — D124 Benign neoplasm of descending colon: Secondary | ICD-10-CM | POA: Diagnosis not present
